# Patient Record
Sex: Female | Born: 1980 | Race: White | Hispanic: Yes | Marital: Married | Smoking: Current every day smoker
Health system: Southern US, Community
[De-identification: ages and names within clinical notes are randomized; demographics above are authoritative.]

## PROBLEM LIST (undated history)

## (undated) DIAGNOSIS — I10 Essential (primary) hypertension: Secondary | ICD-10-CM

## (undated) DIAGNOSIS — K219 Gastro-esophageal reflux disease without esophagitis: Secondary | ICD-10-CM

## (undated) HISTORY — PX: OTHER SURGICAL HISTORY: SHX169

## (undated) HISTORY — PX: HAND SURGERY: SHX662

## (undated) HISTORY — PX: CHOLECYSTECTOMY: SHX55

---

## 2002-01-20 ENCOUNTER — Emergency Department (HOSPITAL_COMMUNITY): Admission: EM | Admit: 2002-01-20 | Discharge: 2002-01-20 | Payer: Self-pay | Admitting: Emergency Medicine

## 2002-01-20 ENCOUNTER — Encounter: Payer: Self-pay | Admitting: Emergency Medicine

## 2002-06-06 ENCOUNTER — Ambulatory Visit (HOSPITAL_COMMUNITY): Admission: RE | Admit: 2002-06-06 | Discharge: 2002-06-06 | Payer: Self-pay | Admitting: Family Medicine

## 2002-06-06 ENCOUNTER — Encounter: Payer: Self-pay | Admitting: Family Medicine

## 2003-01-09 ENCOUNTER — Encounter: Payer: Self-pay | Admitting: Family Medicine

## 2003-01-09 ENCOUNTER — Ambulatory Visit (HOSPITAL_COMMUNITY): Admission: RE | Admit: 2003-01-09 | Discharge: 2003-01-09 | Payer: Self-pay | Admitting: Family Medicine

## 2003-02-26 ENCOUNTER — Encounter: Payer: Self-pay | Admitting: Family Medicine

## 2003-02-26 ENCOUNTER — Ambulatory Visit (HOSPITAL_COMMUNITY): Admission: RE | Admit: 2003-02-26 | Discharge: 2003-02-26 | Payer: Self-pay | Admitting: Family Medicine

## 2003-03-11 ENCOUNTER — Ambulatory Visit (HOSPITAL_COMMUNITY): Admission: RE | Admit: 2003-03-11 | Discharge: 2003-03-11 | Payer: Self-pay | Admitting: Pediatrics

## 2003-08-29 ENCOUNTER — Emergency Department (HOSPITAL_COMMUNITY): Admission: EM | Admit: 2003-08-29 | Discharge: 2003-08-30 | Payer: Self-pay | Admitting: Emergency Medicine

## 2003-11-04 ENCOUNTER — Ambulatory Visit (HOSPITAL_COMMUNITY): Admission: RE | Admit: 2003-11-04 | Discharge: 2003-11-04 | Payer: Self-pay | Admitting: Family Medicine

## 2003-12-01 ENCOUNTER — Ambulatory Visit (HOSPITAL_COMMUNITY): Admission: RE | Admit: 2003-12-01 | Discharge: 2003-12-01 | Payer: Self-pay | Admitting: General Surgery

## 2003-12-02 ENCOUNTER — Emergency Department (HOSPITAL_COMMUNITY): Admission: EM | Admit: 2003-12-02 | Discharge: 2003-12-02 | Payer: Self-pay | Admitting: *Deleted

## 2005-08-05 ENCOUNTER — Ambulatory Visit (HOSPITAL_COMMUNITY): Admission: RE | Admit: 2005-08-05 | Discharge: 2005-08-05 | Payer: Self-pay | Admitting: Family Medicine

## 2006-02-09 ENCOUNTER — Ambulatory Visit (HOSPITAL_COMMUNITY): Admission: RE | Admit: 2006-02-09 | Discharge: 2006-02-09 | Payer: Self-pay | Admitting: Orthopaedic Surgery

## 2006-02-09 ENCOUNTER — Encounter (INDEPENDENT_AMBULATORY_CARE_PROVIDER_SITE_OTHER): Payer: Self-pay | Admitting: *Deleted

## 2006-03-30 ENCOUNTER — Encounter (HOSPITAL_COMMUNITY): Admission: RE | Admit: 2006-03-30 | Discharge: 2006-04-29 | Payer: Self-pay | Admitting: Family Medicine

## 2011-04-15 NOTE — Op Note (Signed)
NAME:  [name], [name]                         ACCOUNT NO.:  [id number]   MEDICAL RECORD NO.:  [id number]                   PATIENT TYPE:  AMB   LOCATION:  DAY                                  FACILITY:  [facility]   PHYSICIAN:  Dalia Heading, M.D.               DATE OF BIRTH:  1981-07-23   DATE OF PROCEDURE:  DATE OF DISCHARGE:                                 OPERATIVE REPORT   PREOPERATIVE DIAGNOSIS:  Cholecystitis, cholelithiasis.   POSTOPERATIVE DIAGNOSIS:  Cholecystitis, cholelithiasis.   PROCEDURE:  Laparoscopic cholecystectomy.   SURGEON:  Dalia Heading, M.D.   ASSISTANT:  Buena Irish, M.D.   ANESTHESIA:  General endotracheal.   INDICATIONS:  The patient is a 30 year old white female who presents with  biliary colic secondary to cholelithiasis.  her liver enzyme tests are  within normal limits except for a slightly elevated SGPT.  Her common bile  duct was noted to be along the upper limits of normal for size, though no  choledocholithiasis was seen.  The patient now comes to the operating room  for laparoscopic cholecystectomy.  The risks and benefits of the procedure  including:  Bleeding, infection, hepatobiliary injury, and the possibility  of an open procedure were fully explained to the patient, who gave informed  consent.   DESCRIPTION OF PROCEDURE:  The patient was placed in the supine position.  After induction of general endotracheal anesthesia, the abdomen was prepped  and draped using the usual sterile technique with Betadine.  Surgical site  confirmation was performed.   An infraumbilical incision was made down to the fascia.  A Veress needle was  introduced into the abdominal cavity and confirmation of placement was done  using the saline drop test.  The abdomen was then insufflated to 16 mmHg  pressure.  An 11-mm trocar was introduced into the abdominal cavity under  direct visualization without difficulty.  The patient was placed in reverse  Trendelenburg position and an additional 11-mm trocar was placed in the  epigastric region and 5-mm trocars were placed in the right upper quadrant  and right flank regions. The liver was inspected and noted to be within  normal limits.   The gallbladder was retracted superiorly and laterally.  The dissection was  begun around the infundibulum of the gallbladder.  The cystic duct was first  identified.  Its junction to the infundibulum fully identified.  The cystic  duct was very small, thus a cholangiogram was not performed.  The cystic  duct was ligated and divided without difficulty.  This was likewise done on  the cystic artery.  The gallbladder was then freed away from the gallbladder  fossa using Bovie electrocautery.  The gallbladder was delivered through the  epigastric trocar site using an EndoCatch bag.  The gallbladder fossa was  inspected and no abnormal bleeding or bile leakage was noted.  Surgicel was  placed in the gallbladder  fossa.  All fluid and air were then evacuated from  the abdominal cavity prior to removal of the trocars.   All wounds were irrigated with normal saline.  All wounds were injected with  0.5% Sensorcaine.  The infraumbilical fascia as well as epigastric fascia  were reapproximated using an #0 Vicryl interrupted suture. All skin  incisions were closed using staples.  Betadine ointment and dry sterile  dressings were applied.   All tape and needle counts correct at the end of the procedure.  The patient  was extubated in the operating room and went back to recovery room in awake  and stable condition.   COMPLICATIONS:  None.   SPECIMENS:  Gallbladder with stones.   BLOOD LOSS:  Minimal.      ___________________________________________                                            Dalia Heading, M.D.   MAJ/MEDQ  D:  12/01/2003  T:  12/01/2003  Job:  [id number]   cc:   Kirk Ruths, M.D.  [address]  [place]  [place] [address]  Fax:  [phone]   Dalia Heading, M.D.  [address], [place]  [place] [address]  Fax: [phone]

## 2011-04-15 NOTE — H&P (Signed)
[name], [name]               ACCOUNT NO.:  [id number]   MEDICAL RECORD NO.:  [id number]          PATIENT TYPE:  AMB   LOCATION:  DAY                           FACILITY:  [facility]   PHYSICIAN:  J. Darreld Mclean, M.D. DATE OF BIRTH:  May 31, 1981   DATE OF ADMISSION:  DATE OF DISCHARGE:  LH                                HISTORY & PHYSICAL   CHIEF COMPLAINT:  Cyst of the left long finger and triggering.   The patient is a 30 year old female who has had pain and tenderness in her  left hand, particularly with the long finger for several weeks. She was seen  by Dr. Regino Schultze in his office on January 30 complaining of pain and swelling  to her left hand and a small cyst present that was palpable over the  metacarpal head. We saw her back in the office on February 19 and also March  7. He had her seen by Dr. Metro Kung who injected her hand, and the cyst  remained and her pain remained, and she is still triggering to her long  finger. She has been on Naprosyn. She has been splinted. She has had rest.  Still having pain and tenderness. She was seen by me on March 12, having  continued pain to her hand. If she grabs anything, it hurts. She would like  to have the cyst removed and something done with the triggering to her  finger. There is no history of trauma. No other joints are affected.   The patient has a history of hypertension. She denies any allergies. She is  currently taking Nexium 40 mg daily and Naprosyn 500 mg b.i.d. She smokes a  pack of cigarettes per day. She does not use alcoholic beverages. Dr.  Regino Schultze is her family doctor. She is status post cholecystectomy in 2005 and  a heart catheterization in 2005. Heart problems and hypertension run in the  family. She says that the hand pain may have begun around December 28 while  at work, but worker's compensation denied any injury and said she had some  problem with her hand at worse. It has gotten progressively worse since   then.   PHYSICAL EXAMINATION:  VITAL SIGNS:  BP 120/84, pulse 78, respiratory rate  16, afebrile. Height 5 foot 2-3/4 inches, 163 pounds.  GENERAL:  She is alert, cooperative, oriented.  HEENT:  Negative.  NECK:  Supple.  LUNGS:  Clear to P&A.  HEART:  Regular rhythm without murmur heard.  ABDOMEN:  Soft, nontender without masses.  EXTREMITIES:  The right hand is negative. The left hand has pain and  tenderness over the left long finger at the A1 pulley area, and there is a  very small ganglion cyst present just proximal to that. Neurovascular is  intact. Grip strengths are normal. She has some slight triggering of the  left long finger.  SKIN:  Intact.   IMPRESSION:  1.  Small ganglion cyst, left hand around the long finger on the volar side      and triggering at the A1 pulley.  2.  History of hypertension.  3.  Status post heart catheterization.   PLAN:  Open removal of the A1 pulley and removal of the ganglion cyst on the  tendon sheath. I explained the procedure, risks and imponderables. She  appears to understand. Labs are pending.                                            ______________________________  J. Darreld Mclean, M.D.     JWK/MEDQ  D:  02/08/2006  T:  02/08/2006  Job:  [id number]

## 2011-04-15 NOTE — H&P (Signed)
NAME:  [name], [place] NO.:  [id number]   MEDICAL RECORD NO.:  [id number]                  PATIENT TYPE:   LOCATION:                                       FACILITY:   PHYSICIAN:  Dalia Heading, M.D.               DATE OF BIRTH:  Jan 27, 1981   DATE OF ADMISSION:  DATE OF DISCHARGE:                                HISTORY & PHYSICAL   CHIEF COMPLAINT:  Biliary colic secondary to cholelithiasis.   HISTORY OF PRESENT ILLNESS:  Patient is a 30 year old white female who was  referred for evaluation and treatment of biliary colic secondary to  cholelithiasis.  She has been having some intermittent episodes of right  flank pain, nausea, and bloating for many months.  Some indigestion is  noted.  She does have fatty food intolerance.  No fever, chills, or jaundice  have been noted.   PAST MEDICAL HISTORY:  Unremarkable.   PAST SURGICAL HISTORY:  She had a heart catheterization approximately one  year ago for heart palpitations which was within normal limits.   CURRENT MEDICATIONS:  None.   ALLERGIES:  NO KNOWN DRUG ALLERGIES.   REVIEW OF SYSTEMS:  The patient does smoke a pack of cigarettes a day.  She  denies alcohol use, she denies any other cardiopulmonary difficulties or  bleeding disorders.  No further heart palpitations have been noted.   PHYSICAL EXAMINATION:  Patient is a well-developed, well-nourished white  female in no acute distress.  She is afebrile and vital signs are stable.  HEENT EXAMINATION:  Reveals no scleral icterus.  LUNGS:  Clear to auscultation with decompressed sounds bilaterally.  HEART EXAMINATION:  Reveals a regular rate and rhythm without S3, S4, or  murmurs.  ABDOMEN:  Soft with slight tenderness noted in the right upper quadrant to  palpation.  No hepatosplenomegaly, masses, hernias are identified.   Ultrasound of the gallbladder reveals cholelithiasis with a borderline  gallbladder bile duct size but no  choledocholithiasis.   IMPRESSION:  Cholecystitis, cholelithiasis.   PLAN:  The patient is scheduled for laparoscopic cholecystectomy with  cholangiograms on December 01, 2003.  The risks and benefits of the procedure  including bleeding, infection, hepatobiliary injury, and the possibility of  an open procedure were fully explained to the patient, who gave informed  consent.     ___________________________________________                                         Dalia Heading, M.D.   MAJ/MEDQ  D:  11/26/2003  T:  11/26/2003  Job:  [id number]   cc:   Kirk Ruths, M.D.  [address]  [place]  [place] [address]  Fax: [phone]

## 2011-04-15 NOTE — Op Note (Signed)
[name], [name]               ACCOUNT NO.:  [id number]   MEDICAL RECORD NO.:  [id number]          PATIENT TYPE:  AMB   LOCATION:  DAY                           FACILITY:  [facility]   PHYSICIAN:  J. Darreld Mclean, M.D. DATE OF BIRTH:  [birth year]   DATE OF PROCEDURE:  02/09/2006  DATE OF DISCHARGE:                                 OPERATIVE REPORT   PREOPERATIVE DIAGNOSES:  1.  Small ganglion cyst off the tendon sheath.  2.  Trigger finger, left long.   POSTOPERATIVE DIAGNOSES:  1.  Small ganglion cyst off the tendon sheath.  2.  Trigger finger, left long.   PROCEDURE:  1.  Excision of a small ganglion cyst off the tendon sheath of the left      long.  2.  Excision of A1 pulley, left long.   ANESTHESIA:  Bier block.   SURGEON:  J. Darreld Mclean, M.D.   DRAINS:  No drains.   TOURNIQUET TIME:  Please refer to anesthesia record.   INDICATIONS:  The patient is a 30 year old female who has had pain and  tenderness in the left long finger. She has a small ganglion cyst.  She has  been seen by Dr. Phillips Odor; and Dr. __________ who did an area of the cyst  which still remains, tenderness remains.  She has some triggering of the  left long finger.  It has not gotten better with conservative treatment and  she desires surgery.  The risks and imponderables of the procedure were  discussed.  I also explained to her that the cyst could reoccur over time.   DESCRIPTION OF PROCEDURE:  The patient was seen in the holding area. The  left hand was identified as the correct site.  I placed a mark directly over  the cyst area that she was complaining about.  She was taken to the  operating room after she had placed a mark on it.  She underwent a Bier  block anesthesia while supine.  She was prepped and draped in the usual  manner.  We had a time out and reidentified the patient, and reidentified  the left long finger, and the palm of the hand as the surgical site.   A long incision was made  with careful dissection.  The tendon sheath was  identified and the neurovascular bundles were identified and retracted.  A  small cyst was present just right off the tendon sheath and it popped in  as we approached it.  It was very small.  I excised this area of the tendon  sheath and also went ahead and released the A1 pulley.  I could see changes  on the tendon sheath which she had been rubbing up against the pulley.   Neurovascular bundles were inspected and no apparent injury.  The wound was  reapproximated using 3-0 Nylon, interrupted vertical mattress manner.  It  should be noted before closure of the wound I looked around and there were  no other signs of any other signs of any other lesion, any other cyst, and  we felt that there was  no other area of abnormality that could be  identified.   The wound was reapproximated with 3-0 Nylon interrupted vertical mattress  manner.  Bulky dressing applied, sheet cotton applied; sheet cotton cut  dorsally; Ace bandage applied, loosely.  The patient tolerated the procedure  well and went to recovery in good condition.  Prescription for Vicodin ES  given for pain.  I will see her in the office in approximately 10 days to 2  weeks; if any difficulty, she is to contact me through the office hospital  beeper system.           ______________________________  Shela Commons. Darreld Mclean, M.D.     JWK/MEDQ  D:  02/09/2006  T:  02/10/2006  Job:  [id number]

## 2013-01-04 ENCOUNTER — Ambulatory Visit (HOSPITAL_COMMUNITY)
Admission: RE | Admit: 2013-01-04 | Discharge: 2013-01-04 | Disposition: A | Discharge status: Home / Self Care | Payer: Medicaid Other | Source: Ambulatory Visit | Attending: Family Medicine | Admitting: Family Medicine

## 2013-01-04 ENCOUNTER — Other Ambulatory Visit (HOSPITAL_COMMUNITY): Payer: Self-pay | Admitting: Family Medicine

## 2013-01-04 DIAGNOSIS — M549 Dorsalgia, unspecified: Secondary | ICD-10-CM

## 2013-01-04 DIAGNOSIS — M546 Pain in thoracic spine: Secondary | ICD-10-CM | POA: Insufficient documentation

## 2013-12-23 ENCOUNTER — Emergency Department (HOSPITAL_COMMUNITY)
Admission: EM | Admit: 2013-12-23 | Discharge: 2013-12-23 | Disposition: A | Discharge status: Home / Self Care | Payer: Medicaid Other | Attending: Emergency Medicine | Admitting: Emergency Medicine

## 2013-12-23 ENCOUNTER — Encounter (HOSPITAL_COMMUNITY): Payer: Self-pay | Admitting: Emergency Medicine

## 2013-12-23 DIAGNOSIS — Z79899 Other long term (current) drug therapy: Secondary | ICD-10-CM | POA: Insufficient documentation

## 2013-12-23 DIAGNOSIS — Z3202 Encounter for pregnancy test, result negative: Secondary | ICD-10-CM | POA: Insufficient documentation

## 2013-12-23 DIAGNOSIS — M545 Low back pain, unspecified: Secondary | ICD-10-CM

## 2013-12-23 DIAGNOSIS — F17200 Nicotine dependence, unspecified, uncomplicated: Secondary | ICD-10-CM | POA: Insufficient documentation

## 2013-12-23 LAB — URINALYSIS, ROUTINE W REFLEX MICROSCOPIC
BILIRUBIN URINE: NEGATIVE
Glucose, UA: NEGATIVE mg/dL
Hgb urine dipstick: NEGATIVE
KETONES UR: NEGATIVE mg/dL
LEUKOCYTES UA: NEGATIVE
NITRITE: NEGATIVE
Protein, ur: NEGATIVE mg/dL
UROBILINOGEN UA: 0.2 mg/dL (ref 0.0–1.0)
pH: 6.5 (ref 5.0–8.0)

## 2013-12-23 LAB — PREGNANCY, URINE: Preg Test, Ur: NEGATIVE

## 2013-12-23 MED ORDER — NAPROXEN 500 MG PO TABS: 500.0000 mg | ORAL_TABLET | Freq: Two times a day (BID) | ORAL | Status: DC

## 2013-12-23 MED ORDER — CYCLOBENZAPRINE HCL 5 MG PO TABS: 5.0000 mg | ORAL_TABLET | Freq: Three times a day (TID) | ORAL | Status: DC | PRN

## 2013-12-23 NOTE — Discharge Instructions (Signed)
Continue ice/heat to your back. Take the medications as prescribed ($ 4 at [facility]). Recheck by your primary doctor if not improving in the next week.     Back Injury Prevention Back injuries can be extremely painful and difficult to heal. After having one back injury, you are much more likely to experience another later on. It is important to learn how to avoid injuring or re-injuring your back. The following tips can help you to prevent a back injury. PHYSICAL FITNESS  Exercise regularly and try to develop good tone in your abdominal muscles. Your abdominal muscles provide a lot of the support needed by your back.  Do aerobic exercises (walking, jogging, biking, swimming) regularly.  Do exercises that increase balance and strength (tai chi, yoga) regularly. This can decrease your risk of falling and injuring your back.  Stretch before and after exercising.  Maintain a healthy weight. The more you weigh, the more stress is placed on your back. For every pound of weight, 10 times that amount of pressure is placed on the back. DIET  Talk to your caregiver about how much calcium and vitamin D you need per day. These nutrients help to prevent weakening of the bones (osteoporosis). Osteoporosis can cause broken (fractured) bones that lead to back pain.  Include good sources of calcium in your diet, such as dairy products, green, leafy vegetables, and products with calcium added (fortified).  Include good sources of vitamin D in your diet, such as milk and foods that are fortified with vitamin D.  Consider taking a nutritional supplement or a multivitamin if needed.  Stop smoking if you smoke. POSTURE  Sit and stand up straight. Avoid leaning forward when you sit or hunching over when you stand.  Choose chairs with good low back (lumbar) support.  If you work at a desk, sit close to your work so you do not need to lean over. Keep your chin tucked in. Keep your neck drawn back and elbows  bent at a right angle. Your arms should look like the letter "L."  Sit high and close to the steering wheel when you drive. Add a lumbar support to your car seat if needed.  Avoid sitting or standing in one position for too long. Take breaks to get up, stretch, and walk around at least once every hour. Take breaks if you are driving for long periods of time.  Sleep on your side with your knees slightly bent, or sleep on your back with a pillow under your knees. Do not sleep on your stomach. LIFTING, TWISTING, AND REACHING  Avoid heavy lifting, especially repetitive lifting. If you must do heavy lifting:  Stretch before lifting.  Work slowly.  Rest between lifts.  Use carts and dollies to move objects when possible.  Make several small trips instead of carrying 1 heavy load.  Ask for help when you need it.  Ask for help when moving big, awkward objects.  Follow these steps when lifting:  Stand with your feet shoulder-width apart.  Get as close to the object as you can. Do not try to pick up heavy objects that are far from your body.  Use handles or lifting straps if they are available.  Bend at your knees. Squat down, but keep your heels off the floor.  Keep your shoulders pulled back, your chin tucked in, and your back straight.  Lift the object slowly, tightening the muscles in your legs, abdomen, and buttocks. Keep the object as close to the  center of your body as possible.  When you put a load down, use these same guidelines in reverse.  Do not:  Lift the object above your waist.  Twist at the waist while lifting or carrying a load. Move your feet if you need to turn, not your waist.  Bend over without bending at your knees.  Avoid reaching over your head, across a table, or for an object on a high surface. OTHER TIPS  Avoid wet floors and keep sidewalks clear of ice to prevent falls.  Do not sleep on a mattress that is too soft or too hard.  Keep items that  are used frequently within easy reach.  Put heavier objects on shelves at waist level and lighter objects on lower or higher shelves.  Find ways to decrease your stress, such as exercise, massage, or relaxation techniques. Stress can build up in your muscles. Tense muscles are more vulnerable to injury.  Seek treatment for depression or anxiety if needed. These conditions can increase your risk of developing back pain. SEEK MEDICAL CARE IF:  You injure your back.  You have questions about diet, exercise, or other ways to prevent back injuries. MAKE SURE YOU:  Understand these instructions.  Will watch your condition.  Will get help right away if you are not doing well or get worse. Document Released: 12/22/2004 Document Revised: 02/06/2012 Document Reviewed: 12/26/2011 [facility] Patient Information 2014 [place], [place].  Back Exercises Back exercises help treat and prevent back injuries. The goal of back exercises is to increase the strength of your abdominal and back muscles and the flexibility of your back. These exercises should be started when you no longer have back pain. Back exercises include:  Pelvic Tilt. Lie on your back with your knees bent. Tilt your pelvis until the lower part of your back is against the floor. Hold this position 5 to 10 sec and repeat 5 to 10 times.  Knee to Chest. Pull first 1 knee up against your chest and hold for 20 to 30 seconds, repeat this with the other knee, and then both knees. This may be done with the other leg straight or bent, whichever feels better.  Sit-Ups or Curl-Ups. Bend your knees 90 degrees. Start with tilting your pelvis, and do a partial, slow sit-up, lifting your trunk only 30 to 45 degrees off the floor. Take at least 2 to 3 seconds for each sit-up. Do not do sit-ups with your knees out straight. If partial sit-ups are difficult, simply do the above but with only tightening your abdominal muscles and holding it as  directed.  Hip-Lift. Lie on your back with your knees flexed 90 degrees. Push down with your feet and shoulders as you raise your hips a couple inches off the floor; hold for 10 seconds, repeat 5 to 10 times.  Back arches. Lie on your stomach, propping yourself up on bent elbows. Slowly press on your hands, causing an arch in your low back. Repeat 3 to 5 times. Any initial stiffness and discomfort should lessen with repetition over time.  Shoulder-Lifts. Lie face down with arms beside your body. Keep hips and torso pressed to floor as you slowly lift your head and shoulders off the floor. Do not overdo your exercises, especially in the beginning. Exercises may cause you some mild back discomfort which lasts for a few minutes; however, if the pain is more severe, or lasts for more than 15 minutes, do not continue exercises until you see your caregiver.  Improvement with exercise therapy for back problems is slow.  See your caregivers for assistance with developing a proper back exercise program. Document Released: 12/22/2004 Document Revised: 02/06/2012 Document Reviewed: 09/15/2011 [facility] Patient Information 2014 [place].

## 2013-12-23 NOTE — ED Notes (Signed)
Patient with no complaints at this time. Respirations even and unlabored. Skin warm/dry. Discharge instructions reviewed with patient at this time. Patient given opportunity to voice concerns/ask questions. Patient discharged at this time and left Emergency Department with steady gait.   

## 2013-12-23 NOTE — ED Provider Notes (Signed)
CSN: [id number]     Arrival date & time 12/23/13  8295 History  This chart was scribed for Ward Givens, MD by Dorothey Baseman, ED Scribe. This patient was seen in room APA05/APA05 and the patient's care was started at 10:27 AM.    Chief Complaint  Patient presents with  . Back Pain   The history is provided by the patient. No language interpreter was used.   HPI Comments: [name] is a 33 y.o. female who presents to the Emergency Department complaining of a constant, pressure-like pain to the lower back, around the sacral region, onset 6 days ago. Patient states that the pain is exacerbated with sitting, standing, and movement. She reports applying heat to the area with mild, temporary relief. She states that the pain will intermittently radiate to the right leg that lasts only a few minutes, which is exacerbated with laying down at night. She reports some intermittent pain to the right arm and points to her prox forearm, but expresses that she does not believe this is related to her back pain. She denies any potential injury or trauma to the area, but states that she does a lot of lifting and bending. Patient reports a history of problems in her back (i.e. Pulled muscles), but states that her current symptoms do not feel similar. She denies fever, emesis, nausea, dysuria, urinary frequency, vaginal discharge, abdominal pain, numbness, bowel or bladder incontinence, neck pain. Patient has no other pertinent medical history.    She states she was seen by her PCP last week and placed on hydrocodone without relief.  PCP Dr Regino Schultze  History reviewed. No pertinent past medical history. Past Surgical History  Procedure Laterality Date  . Cholecystectomy    . Hand surgery     No family history on file. History  Substance Use Topics  . Smoking status: Current Every Day Smoker -- 1.00 packs/day    Types: Cigarettes  . Smokeless tobacco: Not on file  . Alcohol Use: No   unemployed  OB History    Grav Para Term Preterm Abortions TAB SAB Ect Mult Living                 Review of Systems  Constitutional: Negative for fever.  Gastrointestinal: Negative for nausea, vomiting and abdominal pain.  Genitourinary: Negative for dysuria, frequency and vaginal discharge.  Musculoskeletal: Positive for back pain and myalgias. Negative for neck pain.  Neurological: Negative for numbness.  All other systems reviewed and are negative.    Allergies  Review of patient's allergies indicates no known allergies.  Home Medications   Current Outpatient Rx  Name  Route  Sig  Dispense  Refill  . HYDROcodone-acetaminophen (NORCO) 10-325 MG per tablet   Oral   Take 1 tablet by mouth daily as needed for moderate pain.         . naproxen sodium (ANAPROX) 220 MG tablet   Oral   Take 440 mg by mouth daily as needed (pain).         Marland Kitchen omeprazole (PRILOSEC) 20 MG capsule   Oral   Take 20 mg by mouth 2 (two) times daily before a meal.           Triage Vitals: BP 132/88  Pulse 74  Temp(Src) 98.7 F (37.1 C) (Oral)  Resp 16  Ht 5\' 4"  (1.626 m)  Wt 180 lb (81.647 kg)  BMI 30.88 kg/m2  SpO2 100%  LMP 11/28/2013  Vital signs normal  Physical Exam  Nursing note and vitals reviewed. Constitutional: She is oriented to person, place, and time. She appears well-developed and well-nourished.  Non-toxic appearance. She does not appear ill. No distress.  HENT:  Head: Normocephalic and atraumatic.  Right Ear: External ear normal.  Left Ear: External ear normal.  Nose: Nose normal. No mucosal edema or rhinorrhea.  Mouth/Throat: Oropharynx is clear and moist and mucous membranes are normal. No dental abscesses or uvula swelling.  Eyes: Conjunctivae and EOM are normal. Pupils are equal, round, and reactive to light.  Neck: Normal range of motion and full passive range of motion without pain. Neck supple.  Cardiovascular: Normal rate, regular rhythm and normal heart sounds.  Exam reveals no  gallop and no friction rub.   No murmur heard. Pulmonary/Chest: Effort normal and breath sounds normal. No respiratory distress. She has no wheezes. She has no rhonchi. She has no rales. She exhibits no tenderness and no crepitus.  Abdominal: Soft. Normal appearance and bowel sounds are normal. She exhibits no distension. There is no tenderness. There is no rebound and no guarding.  Musculoskeletal: Normal range of motion. She exhibits tenderness. She exhibits no edema.       Back:  Moves all extremities well. Tenderness that begins in the mid-lumbar area with tenderness to the bilateral lumbar paraspinal muscles and over both SI joints. She only has pain with forward flexion with range of motion of her waist. Negative straight-leg raise bilaterally.   Neurological: She is alert and oriented to person, place, and time. She has normal strength and normal reflexes. She displays normal reflexes. No cranial nerve deficit.  Reflex Scores:      Patellar reflexes are 2+ on the right side and 2+ on the left side. Patellar reflexes are 2+ and equal.   Skin: Skin is warm, dry and intact. No rash noted. No erythema. No pallor.  Psychiatric: She has a normal mood and affect. Her speech is normal and behavior is normal. Her mood appears not anxious.    ED Course  Procedures (including critical care time)     DIAGNOSTIC STUDIES: Oxygen Saturation is 100% on room air, normal by my interpretation.    COORDINATION OF CARE: 10:34 AM- Discussed that UA results were normal and do not indicate any infection or signs of kidney stones. Will discharge patient with Naprosyn and Flexeril to manage symptoms. Advised patient to alternate heat and ice at home. Discussed treatment plan with patient at bedside and patient verbalized agreement.   Results for orders placed during the hospital encounter of 12/23/13  URINALYSIS, ROUTINE W REFLEX MICROSCOPIC      Result Value Range   Color, Urine YELLOW  YELLOW    APPearance CLEAR  CLEAR   Specific Gravity, Urine <1.005 (*) 1.005 - 1.030   pH 6.5  5.0 - 8.0   Glucose, UA NEGATIVE  NEGATIVE mg/dL   Hgb urine dipstick NEGATIVE  NEGATIVE   Bilirubin Urine NEGATIVE  NEGATIVE   Ketones, ur NEGATIVE  NEGATIVE mg/dL   Protein, ur NEGATIVE  NEGATIVE mg/dL   Urobilinogen, UA 0.2  0.0 - 1.0 mg/dL   Nitrite NEGATIVE  NEGATIVE   Leukocytes, UA NEGATIVE  NEGATIVE  PREGNANCY, URINE      Result Value Range   Preg Test, Ur NEGATIVE  NEGATIVE    Laboratory interpretation all normal   EKG Interpretation   None       MDM   1. Low back pain    Discharge Medication List  as of 12/23/2013 10:39 AM    START taking these medications   Details  cyclobenzaprine (FLEXERIL) 5 MG tablet Take 1 tablet (5 mg total) by mouth 3 (three) times daily as needed for muscle spasms., Starting 12/23/2013, Until Discontinued, Print    naproxen (NAPROSYN) 500 MG tablet Take 1 tablet (500 mg total) by mouth 2 (two) times daily., Starting 12/23/2013, Until Discontinued, Print        Plan discharge   Devoria Albe, MD, FACEP   I personally performed the services described in this documentation, which was scribed in my presence. The recorded information has been reviewed and considered.  Devoria Albe, MD, Armando Gang     Ward Givens, MD 12/23/13 [phone]

## 2013-12-23 NOTE — ED Notes (Signed)
Patient with c/o back pain x 6 days. Denies injury. Denies urinary symptoms, denies gait problems. Patient states some relief with Aleve. Occupational history of lifting and bending. Patient ambulatory with steady gait. No loss of bowel or bladder. Denies medical history. States pain is pressure in lower back and with certain positions will radiate to right leg.

## 2014-01-29 ENCOUNTER — Emergency Department (HOSPITAL_COMMUNITY): Payer: Self-pay

## 2014-01-29 ENCOUNTER — Emergency Department (HOSPITAL_COMMUNITY)
Admission: EM | Admit: 2014-01-29 | Discharge: 2014-01-29 | Disposition: A | Discharge status: Home / Self Care | Payer: Self-pay | Attending: Emergency Medicine | Admitting: Emergency Medicine

## 2014-01-29 ENCOUNTER — Encounter (HOSPITAL_COMMUNITY): Payer: Self-pay | Admitting: Emergency Medicine

## 2014-01-29 DIAGNOSIS — R071 Chest pain on breathing: Secondary | ICD-10-CM | POA: Insufficient documentation

## 2014-01-29 DIAGNOSIS — F17200 Nicotine dependence, unspecified, uncomplicated: Secondary | ICD-10-CM | POA: Insufficient documentation

## 2014-01-29 DIAGNOSIS — M542 Cervicalgia: Secondary | ICD-10-CM | POA: Insufficient documentation

## 2014-01-29 DIAGNOSIS — Z79899 Other long term (current) drug therapy: Secondary | ICD-10-CM | POA: Insufficient documentation

## 2014-01-29 DIAGNOSIS — R079 Chest pain, unspecified: Secondary | ICD-10-CM

## 2014-01-29 DIAGNOSIS — Z880 Allergy status to penicillin: Secondary | ICD-10-CM | POA: Insufficient documentation

## 2014-01-29 DIAGNOSIS — R1110 Vomiting, unspecified: Secondary | ICD-10-CM | POA: Insufficient documentation

## 2014-01-29 LAB — CBC WITH DIFFERENTIAL/PLATELET
Basophils Absolute: 0 10*3/uL (ref 0.0–0.1)
Basophils Relative: 0 % (ref 0–1)
Eosinophils Absolute: 0.1 10*3/uL (ref 0.0–0.7)
Eosinophils Relative: 2 % (ref 0–5)
HCT: 33.7 % — ABNORMAL LOW (ref 36.0–46.0)
HEMOGLOBIN: 11.3 g/dL — AB (ref 12.0–15.0)
LYMPHS ABS: 2 10*3/uL (ref 0.7–4.0)
Lymphocytes Relative: 48 % — ABNORMAL HIGH (ref 12–46)
MCH: 30.1 pg (ref 26.0–34.0)
MCHC: 33.5 g/dL (ref 30.0–36.0)
MCV: 89.9 fL (ref 78.0–100.0)
MONOS PCT: 2 % — AB (ref 3–12)
Monocytes Absolute: 0.1 10*3/uL (ref 0.1–1.0)
NEUTROS ABS: 2 10*3/uL (ref 1.7–7.7)
Neutrophils Relative %: 48 % (ref 43–77)
PLATELETS: 251 10*3/uL (ref 150–400)
RBC: 3.75 MIL/uL — ABNORMAL LOW (ref 3.87–5.11)
RDW: 16.1 % — ABNORMAL HIGH (ref 11.5–15.5)
WBC: 4.1 10*3/uL (ref 4.0–10.5)

## 2014-01-29 LAB — COMPREHENSIVE METABOLIC PANEL
ALBUMIN: 3.3 g/dL — AB (ref 3.5–5.2)
ALK PHOS: 73 U/L (ref 39–117)
ALT: 21 U/L (ref 0–35)
AST: 23 U/L (ref 0–37)
BILIRUBIN TOTAL: 0.3 mg/dL (ref 0.3–1.2)
BUN: 5 mg/dL — AB (ref 6–23)
CHLORIDE: 101 meq/L (ref 96–112)
CO2: 29 mEq/L (ref 19–32)
Calcium: 8.9 mg/dL (ref 8.4–10.5)
Creatinine, Ser: 0.71 mg/dL (ref 0.50–1.10)
GFR calc non Af Amer: >90 mL/min (ref >90)
GLUCOSE: 91 mg/dL (ref 70–99)
Potassium: 3.2 mEq/L — ABNORMAL LOW (ref 3.7–5.3)
Sodium: 140 mEq/L (ref 137–147)
TOTAL PROTEIN: 7.3 g/dL (ref 6.0–8.3)

## 2014-01-29 LAB — I-STAT TROPONIN, ED: TROPONIN I, POC: 0.01 ng/mL (ref 0.00–0.08)

## 2014-01-29 MED ORDER — ASPIRIN 81 MG PO CHEW
324.0000 mg | CHEWABLE_TABLET | Freq: Once | ORAL | Status: AC
  Administered 2014-01-29: 324 mg via ORAL
  Filled 2014-01-29: qty 4

## 2014-01-29 NOTE — ED Notes (Signed)
Patient complaining of left sided chest pain x 3 days.  

## 2014-01-29 NOTE — ED Provider Notes (Signed)
CSN: [id number]     Arrival date & time 01/29/14  2131 History  This chart was scribed for Geoffery Lyonsouglas Riven Beebe, MD by Danella Maiersaroline Early, ED Scribe. This patient was seen in room APA14/APA14 and the patient's care was started at 11:27 PM.   Chief Complaint  Patient presents with  . Chest Pain   The history is provided by the patient. No language interpreter was used.   HPI Comments: [name] is a 33 y.o. female who presents to the Emergency Department complaining of constant, stabbing frontal CP with pressure onset 3 days ago. She reports associated intermittent neck pain. She reports one episode of vomiting yesterday. She states she walks for exercise but denies CP with activity. She denies pain with breathing. She denies changes in exercise routine or heavy lifting. She has not noticed the CP coming with certain foods. She had a heart cath 9 years ago done at [facility]. She has not had any problems since then. She is otherwise healthy. She reports multiple family h/o MI. She is a smoker.    History reviewed. No pertinent past medical history. Past Surgical History  Procedure Laterality Date  . Cholecystectomy    . Hand surgery    . Cardiac catherization     History reviewed. No pertinent family history. History  Substance Use Topics  . Smoking status: Current Every Day Smoker -- 1.00 packs/day    Types: Cigarettes  . Smokeless tobacco: Not on file  . Alcohol Use: No   OB History   Grav Para Term Preterm Abortions TAB SAB Ect Mult Living                 Review of Systems  Cardiovascular: Positive for chest pain.  Gastrointestinal: Positive for vomiting.  Musculoskeletal: Positive for neck pain.   A complete 10 system review of systems was obtained and all systems are negative except as noted in the HPI and PMH.     Allergies  Amoxicillin  Home Medications   Current Outpatient Rx  Name  Route  Sig  Dispense  Refill  . HYDROcodone-acetaminophen (NORCO) 10-325 MG per tablet  Oral   Take 1 tablet by mouth daily as needed for moderate pain.         Marland Kitchen. ibuprofen (ADVIL,MOTRIN) 200 MG tablet   Oral   Take 600 mg by mouth once as needed for headache.         . naproxen sodium (ANAPROX) 220 MG tablet   Oral   Take 440 mg by mouth daily as needed (pain).         Marland Kitchen. omeprazole (PRILOSEC) 20 MG capsule   Oral   Take 20 mg by mouth 2 (two) times daily before a meal.         . Pseudoephedrine-Acetaminophen (SINUS PAIN/PRESSURE PO)   Oral   Take 2 tablets by mouth once as needed (for symptoms).          BP 174/110  Pulse 91  Temp(Src) 98.3 F (36.8 C) (Oral)  Resp 20  Ht 5\' 4"  (1.626 m)  Wt 180 lb (81.647 kg)  BMI 30.88 kg/m2  SpO2 100%  LMP 01/27/2014 Physical Exam  Nursing note and vitals reviewed. Constitutional: She is oriented to person, place, and time. She appears well-developed and well-nourished. No distress.  HENT:  Head: Normocephalic and atraumatic.  Eyes: EOM are normal.  Neck: Neck supple. No tracheal deviation present.  Cardiovascular: Normal rate and regular rhythm.  Exam reveals no gallop and  no friction rub.   No murmur heard. Pulmonary/Chest: Effort normal. No respiratory distress. She exhibits tenderness (anterior chest wall).  Musculoskeletal: Normal range of motion.  Neurological: She is alert and oriented to person, place, and time.  Skin: Skin is warm and dry.  Psychiatric: She has a normal mood and affect. Her behavior is normal.    ED Course  Procedures (including critical care time) Medications  aspirin chewable tablet 324 mg (324 mg Oral Given 01/29/14 2219)   DIAGNOSTIC STUDIES: Oxygen Saturation is 100% on RA, normal by my interpretation.    COORDINATION OF CARE: 11:40 PM- Discussed treatment plan with pt which includes ibuprofen. Pt agrees to plan.    Labs Review Labs Reviewed  CBC WITH DIFFERENTIAL - Abnormal; Notable for the following:    RBC 3.75 (*)    Hemoglobin 11.3 (*)    HCT 33.7 (*)    RDW  16.1 (*)    Lymphocytes Relative 48 (*)    Monocytes Relative 2 (*)    All other components within normal limits  COMPREHENSIVE METABOLIC PANEL - Abnormal; Notable for the following:    Potassium 3.2 (*)    BUN 5 (*)    Albumin 3.3 (*)    All other components within normal limits  I-STAT TROPOININ, ED   Imaging Review Dg Chest Portable 1 View  01/29/2014   CLINICAL DATA:  CHEST PAIN CHEST PAIN  EXAM: PORTABLE CHEST - 1 VIEW  COMPARISON:  None.  FINDINGS: The heart size and mediastinal contours are within normal limits. Both lungs are clear. The visualized skeletal structures are unremarkable.  IMPRESSION: No active disease.   Electronically Signed   By: Salome Holmes M.D.   On: 01/29/2014 23:06     EKG Interpretation   Date/Time:  Wednesday January 29 2014 21:34:10 EST Ventricular Rate:  94 PR Interval:  168 QRS Duration: 92 QT Interval:  340 QTC Calculation: 425 R Axis:   55 Text Interpretation:  Normal sinus rhythm Normal ECG No previous ECGs  available Confirmed by DELOS  MD, Wrangler Penning ([id number]) on 01/29/2014 11:42:33 PM      MDM   Final diagnoses:  None    Patient is a 33 year old female who presents with complaints of chest discomfort that started approximately 3 days ago. It has been constant in nature. It is located in the front of her chest and there is no associated nausea, diaphoresis, shortness of breath, and no radiation to the arm or jaw. She has no cardiac risk factors. EKG shows a normal sinus rhythm and troponin is negative. Chest x-ray does not reveal any acute pathology. Her pain is reproducible with palpation of the chest wall and I suspect her symptoms are musculoskeletal in nature. We'll recommend ibuprofen, rest, and time. She understands to return if her symptoms worsen or change.  I personally performed the services described in this documentation, which was scribed in my presence. The recorded information has been reviewed and is accurate.      Geoffery Lyons, MD 01/30/14 [phone]

## 2014-01-29 NOTE — Discharge Instructions (Signed)
Ibuprofen 600 mg 3 times daily for the next 5 days.  Return to the ER if you develop worsening pain, shortness of breath, productive cough, or other new or concerning symptoms.   Chest Pain (Nonspecific) It is often hard to give a specific diagnosis for the cause of chest pain. There is always a chance that your pain could be related to something serious, such as a heart attack or a blood clot in the lungs. You need to follow up with your caregiver for further evaluation. CAUSES   Heartburn.  Pneumonia or bronchitis.  Anxiety or stress.  Inflammation around your heart (pericarditis) or lung (pleuritis or pleurisy).  A blood clot in the lung.  A collapsed lung (pneumothorax). It can develop suddenly on its own (spontaneous pneumothorax) or from injury (trauma) to the chest.  Shingles infection (herpes zoster virus). The chest wall is composed of bones, muscles, and cartilage. Any of these can be the source of the pain.  The bones can be bruised by injury.  The muscles or cartilage can be strained by coughing or overwork.  The cartilage can be affected by inflammation and become sore (costochondritis). DIAGNOSIS  Lab tests or other studies, such as X-rays, electrocardiography, stress testing, or cardiac imaging, may be needed to find the cause of your pain.  TREATMENT   Treatment depends on what may be causing your chest pain. Treatment may include:  Acid blockers for heartburn.  Anti-inflammatory medicine.  Pain medicine for inflammatory conditions.  Antibiotics if an infection is present.  You may be advised to change lifestyle habits. This includes stopping smoking and avoiding alcohol, caffeine, and chocolate.  You may be advised to keep your head raised (elevated) when sleeping. This reduces the chance of acid going backward from your stomach into your esophagus.  Most of the time, nonspecific chest pain will improve within 2 to 3 days with rest and mild pain  medicine. HOME CARE INSTRUCTIONS   If antibiotics were prescribed, take your antibiotics as directed. Finish them even if you start to feel better.  For the next few days, avoid physical activities that bring on chest pain. Continue physical activities as directed.  Do not smoke.  Avoid drinking alcohol.  Only take over-the-counter or prescription medicine for pain, discomfort, or fever as directed by your caregiver.  Follow your caregiver's suggestions for further testing if your chest pain does not go away.  Keep any follow-up appointments you made. If you do not go to an appointment, you could develop lasting (chronic) problems with pain. If there is any problem keeping an appointment, you must call to reschedule. SEEK MEDICAL CARE IF:   You think you are having problems from the medicine you are taking. Read your medicine instructions carefully.  Your chest pain does not go away, even after treatment.  You develop a rash with blisters on your chest. SEEK IMMEDIATE MEDICAL CARE IF:   You have increased chest pain or pain that spreads to your arm, neck, jaw, back, or abdomen.  You develop shortness of breath, an increasing cough, or you are coughing up blood.  You have severe back or abdominal pain, feel nauseous, or vomit.  You develop severe weakness, fainting, or chills.  You have a fever. THIS IS AN EMERGENCY. Do not wait to see if the pain will go away. Get medical help at once. Call your local emergency services (911 in [place] Do not drive yourself to the hospital. MAKE SURE YOU:   Understand these instructions.  Will watch your condition.  Will get help right away if you are not doing well or get worse. Document Released: 08/24/2005 Document Revised: 02/06/2012 Document Reviewed: 06/19/2008 [facility] Patient Information 2014 [place].

## 2014-01-29 NOTE — ED Notes (Signed)
Pt alert & oriented x4, stable gait. Patient given discharge instructions, paperwork & prescription(s). Patient  instructed to stop at the registration desk to finish any additional paperwork. Patient verbalized understanding. Pt left department w/ no further questions. 

## 2014-02-06 ENCOUNTER — Emergency Department (HOSPITAL_COMMUNITY): Payer: Self-pay

## 2014-02-06 ENCOUNTER — Encounter (HOSPITAL_COMMUNITY): Payer: Self-pay | Admitting: Emergency Medicine

## 2014-02-06 ENCOUNTER — Emergency Department (HOSPITAL_COMMUNITY)
Admission: EM | Admit: 2014-02-06 | Discharge: 2014-02-07 | Disposition: A | Discharge status: Home / Self Care | Payer: Self-pay | Attending: Emergency Medicine | Admitting: Emergency Medicine

## 2014-02-06 DIAGNOSIS — R42 Dizziness and giddiness: Secondary | ICD-10-CM | POA: Insufficient documentation

## 2014-02-06 DIAGNOSIS — Z880 Allergy status to penicillin: Secondary | ICD-10-CM | POA: Insufficient documentation

## 2014-02-06 DIAGNOSIS — R110 Nausea: Secondary | ICD-10-CM | POA: Insufficient documentation

## 2014-02-06 DIAGNOSIS — Z9889 Other specified postprocedural states: Secondary | ICD-10-CM | POA: Insufficient documentation

## 2014-02-06 DIAGNOSIS — R209 Unspecified disturbances of skin sensation: Secondary | ICD-10-CM | POA: Insufficient documentation

## 2014-02-06 DIAGNOSIS — R079 Chest pain, unspecified: Secondary | ICD-10-CM

## 2014-02-06 DIAGNOSIS — F17200 Nicotine dependence, unspecified, uncomplicated: Secondary | ICD-10-CM | POA: Insufficient documentation

## 2014-02-06 DIAGNOSIS — J189 Pneumonia, unspecified organism: Secondary | ICD-10-CM

## 2014-02-06 DIAGNOSIS — Z79899 Other long term (current) drug therapy: Secondary | ICD-10-CM | POA: Insufficient documentation

## 2014-02-06 DIAGNOSIS — J159 Unspecified bacterial pneumonia: Secondary | ICD-10-CM | POA: Insufficient documentation

## 2014-02-06 LAB — CBC
HCT: 37.4 % (ref 36.0–46.0)
HEMOGLOBIN: 12.6 g/dL (ref 12.0–15.0)
MCH: 30 pg (ref 26.0–34.0)
MCHC: 33.7 g/dL (ref 30.0–36.0)
MCV: 89 fL (ref 78.0–100.0)
Platelets: 259 10*3/uL (ref 150–400)
RBC: 4.2 MIL/uL (ref 3.87–5.11)
RDW: 16.1 % — ABNORMAL HIGH (ref 11.5–15.5)
WBC: 5.4 10*3/uL (ref 4.0–10.5)

## 2014-02-06 LAB — I-STAT TROPONIN, ED: Troponin i, poc: 0 ng/mL (ref 0.00–0.08)

## 2014-02-06 LAB — BASIC METABOLIC PANEL
BUN: 5 mg/dL — AB (ref 6–23)
CALCIUM: 8.9 mg/dL (ref 8.4–10.5)
CO2: 27 meq/L (ref 19–32)
CREATININE: 0.64 mg/dL (ref 0.50–1.10)
Chloride: 101 mEq/L (ref 96–112)
GFR calc Af Amer: >90 mL/min (ref >90)
GLUCOSE: 100 mg/dL — AB (ref 70–99)
Potassium: 3.7 mEq/L (ref 3.7–5.3)
Sodium: 139 mEq/L (ref 137–147)

## 2014-02-06 MED ORDER — ASPIRIN 325 MG PO TABS
325.0000 mg | ORAL_TABLET | Freq: Once | ORAL | Status: AC
  Administered 2014-02-06: 325 mg via ORAL
  Filled 2014-02-06: qty 1

## 2014-02-06 MED ORDER — SODIUM CHLORIDE 0.9 % IV BOLUS (SEPSIS)
1000.0000 mL | Freq: Once | INTRAVENOUS | Status: AC
  Administered 2014-02-06: 1000 mL via INTRAVENOUS

## 2014-02-06 NOTE — ED Notes (Signed)
Pt reports mid chest tightness and pressure, feeling lightheaded, nausea and left arm numbness x 1 week. Airway is intact at triage, no neuro deficits noted, ekg done at triage.

## 2014-02-06 NOTE — ED Provider Notes (Signed)
CSN: [id number]     Arrival date & time 02/06/14  1556 History   First MD Initiated Contact with Patient 02/06/14 2130     Chief Complaint  Patient presents with  . Chest Pain  . Numbness     (Consider location/radiation/quality/duration/timing/severity/associated sxs/prior Treatment) The history is provided by the patient. No language interpreter was used.  [name] is a 33 y/o F with PMHx of cholecystectomy, cardiac cath in 2004 with negative findings presenting to the ED with chest pain that has been ongoing for one week. Patient reported that the chest pain is localized to the center of her chest described as a pressure, sharp tightness that is intermittent lasting approximately 1-2 hours at a time. Stated that with these episodes she has been having associated light-headedness with nausea - negative episodes of emesis. Patient reported that today she experienced numbness to the left arm that lasted 1 hour in accordance to her chest pain. Stated that she has not taken anything for the pain. Denied shortness of breath, difficulty breathing, blurred vision, visual distortions, leg swelling, hemoptysis, weakness. PCP [facility]   History reviewed. No pertinent past medical history. Past Surgical History  Procedure Laterality Date  . Cholecystectomy    . Hand surgery    . Cardiac catherization     History reviewed. No pertinent family history. History  Substance Use Topics  . Smoking status: Current Every Day Smoker -- 1.00 packs/day    Types: Cigarettes  . Smokeless tobacco: Not on file  . Alcohol Use: No   OB History   Grav Para Term Preterm Abortions TAB SAB Ect Mult Living                 Review of Systems  Constitutional: Negative for fever and chills.  Respiratory: Positive for shortness of breath. Negative for chest tightness.   Cardiovascular: Positive for chest pain.  Gastrointestinal: Positive for nausea. Negative for vomiting.  Neurological: Positive for  dizziness and numbness (left arm). Negative for weakness.  All other systems reviewed and are negative.      Allergies  Amoxicillin  Home Medications   Current Outpatient Rx  Name  Route  Sig  Dispense  Refill  . HYDROcodone-acetaminophen (NORCO) 10-325 MG per tablet   Oral   Take 1 tablet by mouth daily as needed for moderate pain.         Marland Kitchen ibuprofen (ADVIL,MOTRIN) 200 MG tablet   Oral   Take 600 mg by mouth daily as needed for headache.          . naproxen sodium (ANAPROX) 220 MG tablet   Oral   Take 440 mg by mouth daily as needed (pain).         Marland Kitchen omeprazole (PRILOSEC) 20 MG capsule   Oral   Take 20 mg by mouth 2 (two) times daily before a meal.         . Pseudoephedrine-Acetaminophen (SINUS PAIN/PRESSURE PO)   Oral   Take 2 tablets by mouth once as needed (for symptoms).         Marland Kitchen azithromycin (ZITHROMAX) 250 MG tablet   Oral   Take 1 tablet (250 mg total) by mouth daily. Take first 2 tablets together, then 1 every day until finished.   6 tablet   0    BP 129/89  Pulse 73  Temp(Src) 98.3 F (36.8 C) (Oral)  Resp 17  SpO2 98%  LMP 01/27/2014 Physical Exam  Nursing note and vitals reviewed. Constitutional:  She is oriented to person, place, and time. She appears well-developed and well-nourished. No distress.  HENT:  Head: Normocephalic and atraumatic.  Mouth/Throat: Oropharynx is clear and moist. No oropharyngeal exudate.  Eyes: Conjunctivae and EOM are normal. Pupils are equal, round, and reactive to light. Right eye exhibits no discharge. Left eye exhibits no discharge.  Neck: Normal range of motion. Neck supple. No tracheal deviation present.  Negative neck stiffness Negative nuchal rigidity  Cardiovascular: Normal rate, regular rhythm and normal heart sounds.  Exam reveals no friction rub.   No murmur heard. Cap refill < 3 seconds   Pulmonary/Chest: Effort normal and breath sounds normal. No respiratory distress. She has no wheezes. She  has no rales.  Musculoskeletal: Normal range of motion.  Full ROM to upper and lower extremities without difficulty noted, negative ataxia noted.  Lymphadenopathy:    She has no cervical adenopathy.  Neurological: She is alert and oriented to person, place, and time. No cranial nerve deficit. She exhibits normal muscle tone. Coordination normal.  Cranial nerves III-XII grossly intact Strength 5+/5+ to upper and lower extremities bilaterally with resistance applied, equal distribution noted Equal grip strength  Sensation intact to upper left extremity with differentiation to sharp and dull touch   Skin: Skin is warm and dry. No rash noted. She is not diaphoretic. No erythema.  Psychiatric: She has a normal mood and affect. Her behavior is normal. Thought content normal.    ED Course  Procedures (including critical care time)  11:48 PM This provider was made aware that the patient does not want IV - stated that if she is able to go home she would like to go home.   Pulse ox upon ambulation was within normal limits, the lowest the pulse ox got was 91% - negative shortness of breath or difficulty breathing.   Labs Review Labs Reviewed  CBC - Abnormal; Notable for the following:    RDW 16.1 (*)    All other components within normal limits  BASIC METABOLIC PANEL - Abnormal; Notable for the following:    Glucose, Bld 100 (*)    BUN 5 (*)    All other components within normal limits  I-STAT TROPOININ, ED  Rosezena Sensor, ED   Imaging Review Dg Chest 2 View  02/06/2014   CLINICAL DATA:  Chest pain  EXAM: CHEST  2 VIEW  COMPARISON:  January 29, 2014  FINDINGS: There is a small area of infiltrate in the right middle lobe, better seen on the lateral view. Elsewhere lungs are clear. Heart size and pulmonary vascularity are normal. No adenopathy. No pneumothorax. No bone lesions.  IMPRESSION: Infiltrate in right middle lobe, better seen on lateral view.   Electronically Signed   By: Bretta Bang M.D.   On: 02/06/2014 16:51     EKG Interpretation None      Date: 02/07/2014  Rate: 101  Rhythm: normal sinus rhythm  QRS Axis: normal  Intervals: normal  ST/T Wave abnormalities: normal  Conduction Disutrbances:none  Narrative Interpretation:   Old EKG Reviewed: unchanged EKG analyzed and reviewed by attending physician and this provider.    MDM   Final diagnoses:  CAP (community acquired pneumonia)  Chest pain   Medications  aspirin tablet 325 mg (325 mg Oral Given 02/06/14 2339)  sodium chloride 0.9 % bolus 1,000 mL (0 mLs Intravenous Stopped 02/07/14 0109)   Filed Vitals:   02/06/14 2054 02/06/14 2230 02/07/14 0030 02/07/14 0109  BP: 136/85 139/87 138/94 129/89  Pulse: 76 68 73   Temp: 98.3 F (36.8 C)     TempSrc: Oral     Resp: 19   17  SpO2: 98% 98% 98% 98%    Patient presenting to the ED with chest pain that has been ongoing intermittently throughout the past week - patient reported that this pain is localized to the center of the chest described as a chest tightness, pressure sensation that lasts approximately 1-2 hours. Stated that she has associated light-headedness, nausea. Reported that this afternoon she experienced numbness in her left arm. Stated that she has taken nothing for the pain.  This provider reviewed the patient's chart. Patient was seen and assessed in the ED setting on 01/29/2014 where EKG noted normal sinus. Patient had troponin performed with negative elevation with unremarkable findings on the chest xray. Patient reported that she had a cardiac cath performed in 2004 that was negative.  Alert and oriented. GCS 15. Heart rate and rhythm normal. Lungs clear to auscultation to upper and lower lobes bilaterally. Cap refill less than 3 seconds. Negative swelling or pitting edema identified. Radial and DP pulses 2+ bilaterally. Negative pain upon palpation to the chest wall. Full range of motion to upper and lower extremities bilaterally.  Strength intact with equal distribution-equal grip strength noted. Sensation intact with differentiation sharp and dull touch to left upper extremity. EKG noted mild sinus tachycardia with a heart rate of 101 beats per minute. First I stat troponin, negative elevation. Second i stat troponin negative elevation. CBC negative elevated white blood cell count. BMP negative findings. Chest x-ray noted infiltrate in right middle lobe.  HEART score 0. PERC score low- doubt PE. Patient presenting to the ED with chest pain that has been ongoing for the past week. Patient has pneumonia noted on the chest xray. Patient stable, afebrile. Negative signs of respiratory distress - negative hypoxia noted. Discussed case with attending physician who agreed to discharge. Patient discharged with antibiotics. Referred patient to Dr. Daleen SquibbWall and PCP. Discussed with patient to rest and stay hydrated. Discussed with patient to closely monitor symptoms and if symptoms are to worsen or change to report back to the ED - strict return instructions given.  Patient agreed to plan of care, understood, all questions answered.   Raymon MuttonMarissa Jamariya Davidoff, PA-C 02/07/14 1349

## 2014-02-07 LAB — I-STAT TROPONIN, ED: Troponin i, poc: 0 ng/mL (ref 0.00–0.08)

## 2014-02-07 MED ORDER — AZITHROMYCIN 250 MG PO TABS: 250.0000 mg | ORAL_TABLET | Freq: Every day | ORAL | Status: DC

## 2014-02-07 MED ORDER — AZITHROMYCIN 250 MG PO TABS: 250.0000 mg | ORAL_TABLET | Freq: Once | ORAL | Status: DC

## 2014-02-07 NOTE — ED Notes (Signed)
Pt ambulated in hall- 02 91%, HR 110.  Denies shortness of breath with ambulation.

## 2014-02-07 NOTE — Discharge Instructions (Signed)
Please call your doctor for a followup appointment within 24-48 hours. When you talk to your doctor please let them know that you were seen in the emergency department and have them acquire all of your records so that they can discuss the findings with you and formulate a treatment plan to fully care for your new and ongoing problems. Please call and set-up an appointment with your primary care provider to be seen and re-assessed within the next 24-48 hours Please rest and stay hydrated Please take antibiotics as prescribed Please call and set-up an appointment with Dr. Daleen Squibb regarding ongoing chest pain  Please continue to monitor symptoms and if symptoms are to worsen or change (fever greater than 101, chills, chest pain, shortness of breath, difficulty breathing, numbness, tingling, weakness, dizziness, fainting, nausea, vomiting, diarrhea, blood in the stools, coughing up blood) please report back to the ED immediately    Chest Pain (Nonspecific) It is often hard to give a specific diagnosis for the cause of chest pain. There is always a chance that your pain could be related to something serious, such as a heart attack or a blood clot in the lungs. You need to follow up with your caregiver for further evaluation. CAUSES   Heartburn.  Pneumonia or bronchitis.  Anxiety or stress.  Inflammation around your heart (pericarditis) or lung (pleuritis or pleurisy).  A blood clot in the lung.  A collapsed lung (pneumothorax). It can develop suddenly on its own (spontaneous pneumothorax) or from injury (trauma) to the chest.  Shingles infection (herpes zoster virus). The chest wall is composed of bones, muscles, and cartilage. Any of these can be the source of the pain.  The bones can be bruised by injury.  The muscles or cartilage can be strained by coughing or overwork.  The cartilage can be affected by inflammation and become sore (costochondritis). DIAGNOSIS  Lab tests or other studies,  such as X-rays, electrocardiography, stress testing, or cardiac imaging, may be needed to find the cause of your pain.  TREATMENT   Treatment depends on what may be causing your chest pain. Treatment may include:  Acid blockers for heartburn.  Anti-inflammatory medicine.  Pain medicine for inflammatory conditions.  Antibiotics if an infection is present.  You may be advised to change lifestyle habits. This includes stopping smoking and avoiding alcohol, caffeine, and chocolate.  You may be advised to keep your head raised (elevated) when sleeping. This reduces the chance of acid going backward from your stomach into your esophagus.  Most of the time, nonspecific chest pain will improve within 2 to 3 days with rest and mild pain medicine. HOME CARE INSTRUCTIONS   If antibiotics were prescribed, take your antibiotics as directed. Finish them even if you start to feel better.  For the next few days, avoid physical activities that bring on chest pain. Continue physical activities as directed.  Do not smoke.  Avoid drinking alcohol.  Only take over-the-counter or prescription medicine for pain, discomfort, or fever as directed by your caregiver.  Follow your caregiver's suggestions for further testing if your chest pain does not go away.  Keep any follow-up appointments you made. If you do not go to an appointment, you could develop lasting (chronic) problems with pain. If there is any problem keeping an appointment, you must call to reschedule. SEEK MEDICAL CARE IF:   You think you are having problems from the medicine you are taking. Read your medicine instructions carefully.  Your chest pain does not go  away, even after treatment.  You develop a rash with blisters on your chest. SEEK IMMEDIATE MEDICAL CARE IF:   You have increased chest pain or pain that spreads to your arm, neck, jaw, back, or abdomen.  You develop shortness of breath, an increasing cough, or you are  coughing up blood.  You have severe back or abdominal pain, feel nauseous, or vomit.  You develop severe weakness, fainting, or chills.  You have a fever. THIS IS AN EMERGENCY. Do not wait to see if the pain will go away. Get medical help at once. Call your local emergency services (911 in U.S.). Do not drive yourself to the hospital. MAKE SURE YOU:   Understand these instructions.  Will watch your condition.  Will get help right away if you are not doing well or get worse. Document Released: 08/24/2005 Document Revised: 02/06/2012 Document Reviewed: 06/19/2008 [facility] Patient Information 2014 [place], [place]. Pneumonia, Adult Pneumonia is an infection of the lungs.  CAUSES Pneumonia may be caused by bacteria or a virus. Usually, these infections are caused by breathing infectious particles into the lungs (respiratory tract). SYMPTOMS   Cough.  Fever.  Chest pain.  Increased rate of breathing.  Wheezing.  Mucus production. DIAGNOSIS  If you have the common symptoms of pneumonia, your caregiver will typically confirm the diagnosis with a chest X-ray. The X-ray will show an abnormality in the lung (pulmonary infiltrate) if you have pneumonia. Other tests of your blood, urine, or sputum may be done to find the specific cause of your pneumonia. Your caregiver may also do tests (blood gases or pulse oximetry) to see how well your lungs are working. TREATMENT  Some forms of pneumonia may be spread to other people when you cough or sneeze. You may be asked to wear a mask before and during your exam. Pneumonia that is caused by bacteria is treated with antibiotic medicine. Pneumonia that is caused by the influenza virus may be treated with an antiviral medicine. Most other viral infections must run their course. These infections will not respond to antibiotics.  PREVENTION A pneumococcal shot (vaccine) is available to prevent a common bacterial cause of pneumonia. This is usually  suggested for:  People over 55 years old.  Patients on chemotherapy.  People with chronic lung problems, such as bronchitis or emphysema.  People with immune system problems. If you are over 65 or have a high risk condition, you may receive the pneumococcal vaccine if you have not received it before. In some countries, a routine influenza vaccine is also recommended. This vaccine can help prevent some cases of pneumonia.You may be offered the influenza vaccine as part of your care. If you smoke, it is time to quit. You may receive instructions on how to stop smoking. Your caregiver can provide medicines and counseling to help you quit. HOME CARE INSTRUCTIONS   Cough suppressants may be used if you are losing too much rest. However, coughing protects you by clearing your lungs. You should avoid using cough suppressants if you can.  Your caregiver may have prescribed medicine if he or she thinks your pneumonia is caused by a bacteria or influenza. Finish your medicine even if you start to feel better.  Your caregiver may also prescribe an expectorant. This loosens the mucus to be coughed up.  Only take over-the-counter or prescription medicines for pain, discomfort, or fever as directed by your caregiver.  Do not smoke. Smoking is a common cause of bronchitis and can contribute to pneumonia.  If you are a smoker and continue to smoke, your cough may last several weeks after your pneumonia has cleared.  A cold steam vaporizer or humidifier in your room or home may help loosen mucus.  Coughing is often worse at night. Sleeping in a semi-upright position in a recliner or using a couple pillows under your head will help with this.  Get rest as you feel it is needed. Your body will usually let you know when you need to rest. SEEK IMMEDIATE MEDICAL CARE IF:   Your illness becomes worse. This is especially true if you are elderly or weakened from any other disease.  You cannot control your  cough with suppressants and are losing sleep.  You begin coughing up blood.  You develop pain which is getting worse or is uncontrolled with medicines.  You have a fever.  Any of the symptoms which initially brought you in for treatment are getting worse rather than better.  You develop shortness of breath or chest pain. MAKE SURE YOU:   Understand these instructions.  Will watch your condition.  Will get help right away if you are not doing well or get worse. Document Released: 11/14/2005 Document Revised: 02/06/2012 Document Reviewed: 02/03/2011 [facility] Patient Information 2014 [place].   Emergency Department Resource Guide 1) Find a Doctor and Pay Out of Pocket Although you won't have to find out who is covered by your insurance plan, it is a good idea to ask around and get recommendations. You will then need to call the office and see if the doctor you have chosen will accept you as a new patient and what types of options they offer for patients who are self-pay. Some doctors offer discounts or will set up payment plans for their patients who do not have insurance, but you will need to ask so you aren't surprised when you get to your appointment.  2) Contact Your Local Health Department Not all health departments have doctors that can see patients for sick visits, but many do, so it is worth a call to see if yours does. If you don't know where your local health department is, you can check in your phone book. The CDC also has a tool to help you locate your state's health department, and many state websites also have listings of all of their local health departments.  3) Find a Walk-in Clinic If your illness is not likely to be very severe or complicated, you may want to try a walk in clinic. These are popping up all over the country in pharmacies, drugstores, and shopping centers. They're usually staffed by nurse practitioners or physician assistants that have been trained  to treat common illnesses and complaints. They're usually fairly quick and inexpensive. However, if you have serious medical issues or chronic medical problems, these are probably not your best option.  No Primary Care Doctor: - Call Health Connect at  [phone] - they can help you locate a primary care doctor that  accepts your insurance, provides certain services, etc. - Physician Referral Service- [phone]  Chronic Pain Problems: Organization         Address  Phone   Notes  [facility]  [phone] Patients need to be referred by their primary care doctor.   Medication Assistance: Organization         Address  Phone   Notes  [facility] [place] [facility] [address] [place], [place] [phone] --Must  be a resident of [facility] -- Must have NO insurance coverage whatsoever (no Medicaid/ Medicare, etc.) -- The pt. MUST have a primary care doctor that directs their care regularly and follows them in the community   [facility]  [phone]   [employer]  [phone]    Agencies that provide inexpensive medical care: Organization         Address  Phone   Notes  [facility]  [phone]   [facility]    [phone]   [facility] [address] [place], [place] [address] [phone]   [facility] [address] [place] [phone]   Planned Parenthood    [phone]   [facility]    [phone]   [facility]  [address], [place] Phone:  [phone], Fax:  [phone] Hours of Operation:  9 am - 6 pm, M-F.  Also accepts Medicaid/Medicare and self-pay.  [facility]  [address], [place] Phone: [phone], Fax: [phone]. Hours of Operation:  8:30 am - 5:30 pm, M-F.  Also accepts Medicaid and self-pay.   [facility] [place] [address], [place] Phone: [phone]   [facility] [address] Natasha Bence [place], [place] [phone], Ext. [phone] Mondays & Thursdays: 7-9 AM.  First 15 patients are seen on a first come, first serve basis.    Medicaid-accepting [facility] Providers:  Organization         Address  Phone   Notes  [facility] [address], [place] [phone] Also accepts self-pay patients.  [facility] [address] Laurell Josephs [place], [place]  [phone]   [facility] [address], [place] [phone]   [facility] [address], [place] [phone]   Renaye Rakers [address], [place]   [phone] Only accepts [place] Access [place] patients after they have their name applied to their card.   Self-Pay (no insurance) in [facility]:  Organization         Address  Phone   Notes  Sickle Cell Patients, [facility] Internal Medicine [address] [place], [place] [phone]   [facility] [address] [place], [place] [phone]   [facility]  [address], [place] [phone]   [facility]/Dr. Osei-Bonsu  [address], [place] or [address], [place] [phone] Phone number for both [place] and [place] locations is the same.  [facility]  [facility] [address], [place] [phone]   [facility] [address], [place] or [address] [phone]   [facility] [address] [place] [phone], phone; [phone], fax Sees patients 1st and 3rd Saturday of every month.  Must not qualify for public or private insurance (i.e. Medicaid, Medicare,  Health Choice, Veterans' Benefits)  Household income should be no  more than 200% of the poverty level The clinic cannot treat you if you are pregnant or think you are pregnant  Sexually transmitted diseases are not treated at the clinic.    Dental Care: Organization  Address  Phone  Notes  [facility] Department of [facility] [address] [place], [place] [phone] Accepts children up to age 44 who are enrolled in [place] or [place] Health Choice; pregnant women with a Medicaid card; and children who have applied for Medicaid or [place] Health Choice, but were declined, whose parents can pay a reduced fee at time of service.  [facility] Department of [facility]  [address], [place] [phone] Accepts children up to age 84 who are enrolled in [place] or [place] Health Choice; pregnant women with a Medicaid card; and children who have applied for Medicaid or [place] Health Choice, but were declined, whose parents can pay a reduced fee at time of service.  [facility]  [address] [place], [place] [phone] Patients are seen by appointment only. Walk-ins are not accepted. [facility] will see patients 76 years of age and older. Monday - Tuesday (8am-5pm) Most Wednesdays (8:30-5pm) $30 per visit, cash only  [facility]  [address], [facility] [phone] Patients are seen by appointment only. Walk-ins are not accepted. [facility] will see patients 100 years of age and older. One Wednesday Evening (Monthly: Volunteer Based).  $30 per visit, cash only  [employer]  [phone] for adults; Children under age 68, call Graduate Pediatric Dentistry at [phone]. Children aged 20-14, please call [phone] to request a pediatric application.  Dental services are provided in all areas of dental care including fillings, crowns and bridges, complete and partial dentures, implants, gum treatment, root canals,  and extractions. Preventive care is also provided. Treatment is provided to both adults and children. Patients are selected via a lottery and there is often a waiting list.   [facility] [address], [place]  [phone] [email or web address]   [facility] [address] [place], [place] [phone], Ext. [phone] Second and Fourth Thursday of each month, opens at 6:30 AM; Clinic ends at 9 AM.  Patients are seen on a first-come first-served basis, and a limited number are seen during each clinic.   [facility]  [address] Ether Griffins [place], [place] [phone]   Eligibility Requirements You must have lived in [place], [place], or [place] counties for at least the last three months.   You cannot be eligible for state or federal sponsored [place], including [employer], [place], or [employer].   You generally cannot be eligible for healthcare insurance through your employer.    How to apply: Eligibility screenings are held every Tuesday and Wednesday afternoon from 1:00 pm until 4:00 pm. You do not need an appointment for the interview!  [facility] [address] [place], [place] [phone]   [facility] Health Department  [phone]   [facility] Health Department  [phone]   [facility] Health Department  [phone]    Behavioral Health Resources in the Community: Intensive Outpatient Programs Organization         Address  Phone  Notes  [facility] Services [address], [place], [place] [phone]   [facility] Outpatient [address], [place], [place] [phone]   [facility] [address], [place], [place]  [phone]   [facility] Mental [facility] [address],  [place], [place] [phone] or [phone]   Substance Abuse Resources Organization  Address  Phone  Notes  [facility]  [phone]    [facility]  [phone]   [facility] [place]  [phone]   Floydene Flock  [phone]   Residential & Outpatient Substance Abuse Program  [phone]   Psychological Services Organization         Address  Phone  Notes  [facility] Behavior[facility] Health  [phone]   [facility] Services  [phone]   [facility] [address] [place] [phone] or [phone]    Mobile Crisis Teams Organization         Address  Phone  Notes  [facility], Mobile Crisis Care Unit  [phone]   [facility]  [address] [place], [place] [phone]   Doristine Locks [address] [place] [phone]    Self-Help/Support Groups Organization         Address  Phone             Notes  [facility] [place] - variety of support groups  336- I7437963 Call for more information  Narcotics Anonymous (NA), [facility] [address], [employer] [place]  2 meetings at this location   Statistician         Address  Phone  Notes  [facility] [address],    [place]  [phone]   [facility]  [address] [place] [address], [place], [place] [phone]   [facility] [address] [place], [place] [phone] Admissions: 8am-3pm M-F  [facility] [address]    [place], [place] [phone]   [facility] [address] [place], [place], [place] [phone]   [facility] [address]  [place], [place] [phone]   [facility] - Intensive Outpatient [address] Laurell Josephs [address], [place], [place] [phone]   [facility] ([facility] [address] [place]  [place], [place] [phone] or [phone]   [facility] ([facility]) [address] [place], [place] [phone] Accepts Medicaid  [facility] [address],    [place] [phone] Substance Abuse/Addiction Treatment   [facility] Organization         Address  Phone  Notes  [facility]  [phone]   Angie Fava, PhD [address] Ervin Knack [place], [place]   [phone] or [phone]   [facility] Behavioral   [address] [place], [place] [phone]   [facility] [address], [place], [place] [phone] Insurance/Medicaid/sponsorship through [facility] and Families [address]                                    [place], [place] [phone] Therapy/tele-psych/case  [facility] [address], [place] [phone]    Dr. Lolly Mustache  [phone]   [facility]  [employer] [facility] Dept. 1) [address], [place] 2) [address] [place] 3)  [address], [place] [phone]  [phone]   [facility] Child Abuse Hotline 562-465-9971 or [phone] (After Hours)

## 2014-02-08 NOTE — ED Provider Notes (Signed)
Medical screening examination/treatment/procedure(s) were performed by non-physician practitioner and as supervising physician I was immediately available for consultation/collaboration.   EKG Interpretation None       Olivia Mackielga M Argenis Kumari, MD 02/08/14 [id number]

## 2014-05-16 ENCOUNTER — Emergency Department (HOSPITAL_COMMUNITY): Payer: Self-pay

## 2014-05-16 ENCOUNTER — Encounter (HOSPITAL_COMMUNITY): Payer: Self-pay | Admitting: Emergency Medicine

## 2014-05-16 ENCOUNTER — Emergency Department (HOSPITAL_COMMUNITY)
Admission: EM | Admit: 2014-05-16 | Discharge: 2014-05-17 | Disposition: A | Discharge status: Home / Self Care | Payer: Self-pay | Attending: Emergency Medicine | Admitting: Emergency Medicine

## 2014-05-16 DIAGNOSIS — Z791 Long term (current) use of non-steroidal anti-inflammatories (NSAID): Secondary | ICD-10-CM | POA: Insufficient documentation

## 2014-05-16 DIAGNOSIS — R079 Chest pain, unspecified: Secondary | ICD-10-CM

## 2014-05-16 DIAGNOSIS — R072 Precordial pain: Secondary | ICD-10-CM | POA: Insufficient documentation

## 2014-05-16 DIAGNOSIS — Z880 Allergy status to penicillin: Secondary | ICD-10-CM | POA: Insufficient documentation

## 2014-05-16 DIAGNOSIS — F17200 Nicotine dependence, unspecified, uncomplicated: Secondary | ICD-10-CM | POA: Insufficient documentation

## 2014-05-16 DIAGNOSIS — K219 Gastro-esophageal reflux disease without esophagitis: Secondary | ICD-10-CM | POA: Insufficient documentation

## 2014-05-16 DIAGNOSIS — I10 Essential (primary) hypertension: Secondary | ICD-10-CM | POA: Insufficient documentation

## 2014-05-16 DIAGNOSIS — R0789 Other chest pain: Secondary | ICD-10-CM

## 2014-05-16 DIAGNOSIS — Z792 Long term (current) use of antibiotics: Secondary | ICD-10-CM | POA: Insufficient documentation

## 2014-05-16 HISTORY — DX: Gastro-esophageal reflux disease without esophagitis: K21.9

## 2014-05-16 HISTORY — DX: Essential (primary) hypertension: I10

## 2014-05-16 MED ORDER — ASPIRIN 81 MG PO CHEW
324.0000 mg | CHEWABLE_TABLET | Freq: Once | ORAL | Status: AC
  Administered 2014-05-16: 324 mg via ORAL
  Filled 2014-05-16: qty 4

## 2014-05-16 NOTE — ED Notes (Signed)
Patient complaining of chest pain radiating into left shoulder and left jaw starting tonight at 2130.

## 2014-05-16 NOTE — ED Provider Notes (Signed)
CSN: [id number]     Arrival date & time 05/16/14  2237 History   None    Chief Complaint  Patient presents with  . Chest Pain     (Consider location/radiation/quality/duration/timing/severity/associated sxs/prior Treatment) HPI Comments: Patient states that her chest pain started at about 9:30 tonight. The pain is in the substernal area and radiates to the left shoulder and left jaw. The pain has been mostly continuous, and continues even into the emergency department visit. Patient had a similar episode in March of 2015, it was found to be pneumonia. No recent injury or trauma to the chest. Patient denies the use of any street drugs. There's been no injury or trauma to the chest.  Patient is a 33 y.o. female presenting with chest pain. The history is provided by the patient.  Chest Pain Pain location:  Substernal area Pain quality: sharp   Pain radiates to:  L jaw and L shoulder Pain radiates to the back: no   Pain severity:  Moderate Onset quality:  Sudden Duration:  1 hour Timing:  Intermittent Chronicity:  Recurrent Context: no drug use, not lifting and no movement   Relieved by:  Nothing Ineffective treatments:  None tried Associated symptoms: no abdominal pain, no altered mental status, no back pain, no cough, no dizziness, no fever, no nausea, no palpitations, no PND, no shortness of breath and no syncope   Risk factors: hypertension and smoking   Risk factors: no birth control, no diabetes mellitus and no high cholesterol     Past Medical History  Diagnosis Date  . Hypertension   . Acid reflux    Past Surgical History  Procedure Laterality Date  . Cholecystectomy    . Hand surgery    . Cardiac catherization     History reviewed. No pertinent family history. History  Substance Use Topics  . Smoking status: Current Every Day Smoker -- 1.00 packs/day    Types: Cigarettes  . Smokeless tobacco: Not on file  . Alcohol Use: No   OB History   Grav Para Term Preterm  Abortions TAB SAB Ect Mult Living                 Review of Systems  Constitutional: Negative for fever and activity change.       All ROS Neg except as noted in HPI  HENT: Negative for nosebleeds.   Eyes: Negative for photophobia and discharge.  Respiratory: Negative for cough, shortness of breath and wheezing.   Cardiovascular: Positive for chest pain. Negative for palpitations, syncope and PND.  Gastrointestinal: Negative for nausea, abdominal pain and blood in stool.  Genitourinary: Negative for dysuria, frequency and hematuria.  Musculoskeletal: Negative for arthralgias, back pain and neck pain.  Skin: Negative.   Neurological: Negative for dizziness, seizures and speech difficulty.  Psychiatric/Behavioral: Negative for hallucinations and confusion.      Allergies  Amoxicillin  Home Medications   Prior to Admission medications   Medication Sig Start Date End Date Taking? Authorizing Provider  azithromycin (ZITHROMAX) 250 MG tablet Take 1 tablet (250 mg total) by mouth daily. Take first 2 tablets together, then 1 every day until finished. 02/07/14   Marissa Sciacca, PA-C  HYDROcodone-acetaminophen (NORCO) 10-325 MG per tablet Take 1 tablet by mouth daily as needed for moderate pain.    Historical Provider, MD  ibuprofen (ADVIL,MOTRIN) 200 MG tablet Take 600 mg by mouth daily as needed for headache.     Historical Provider, MD  naproxen sodium (ANAPROX) 220  MG tablet Take 440 mg by mouth daily as needed (pain).    Historical Provider, MD  omeprazole (PRILOSEC) 20 MG capsule Take 20 mg by mouth 2 (two) times daily before a meal.    Historical Provider, MD  Pseudoephedrine-Acetaminophen (SINUS PAIN/PRESSURE PO) Take 2 tablets by mouth once as needed (for symptoms).    Historical Provider, MD   BP 123/79  Pulse 86  Temp(Src) 98.1 F (36.7 C) (Oral)  Resp 16  Ht 5\' 4"  (1.626 m)  Wt 180 lb (81.647 kg)  BMI 30.88 kg/m2  SpO2 99%  LMP 05/15/2014 Physical Exam  Nursing note  and vitals reviewed. Constitutional: She is oriented to person, place, and time. She appears well-developed and well-nourished.  Non-toxic appearance.  HENT:  Head: Normocephalic.  Right Ear: Tympanic membrane and external ear normal.  Left Ear: Tympanic membrane and external ear normal.  Eyes: EOM and lids are normal. Pupils are equal, round, and reactive to light.  Neck: Normal range of motion. Neck supple. Carotid bruit is not present.  Cardiovascular: Normal rate, regular rhythm, normal heart sounds, intact distal pulses and normal pulses.  Exam reveals no gallop and no friction rub.   No murmur heard. Pulmonary/Chest: Breath sounds normal. No respiratory distress. She has no wheezes. She has no rales. She exhibits tenderness.  Moderate lower sternal pain to palpation.  Abdominal: Soft. Bowel sounds are normal. There is no tenderness. There is no guarding.  Musculoskeletal: Normal range of motion. She exhibits no edema and no tenderness.  Neg Homan's Sign.  Lymphadenopathy:       Head (right side): No submandibular adenopathy present.       Head (left side): No submandibular adenopathy present.    She has no cervical adenopathy.  Neurological: She is alert and oriented to person, place, and time. She has normal strength. No cranial nerve deficit or sensory deficit.  Skin: Skin is warm and dry.  Psychiatric: She has a normal mood and affect. Her speech is normal.    ED Course  Procedures (including critical care time) Labs Review Labs Reviewed - No data to display  Imaging Review No results found.   EKG Interpretation None      MDM Vital signs are well within normal limits. Pulse oximetry is 99% on room. Within normal limits by my interpretation.  The electrocardiogram showed a sinus arrhythmia, no acute event noted. Recheck of the patient reveals patient to be feeling some better with improvement of the shoulder jaw and chest area pain.  The urinalysis was  noncontributory. Comprehensive metabolic panel showed a potassium be slightly low at 3.6, otherwise within normal limits.  The complete blood count showed the hemoglobin be slightly low at 1, otherwise within normal limits. 1.6 the hematocrit slightly low at 3Portable chest x-ray showed no acute cardiopulmonary problems. Troponin is negative at less than 0.30. Lipase is normal at 34.  There is no temperature elevation. The chest x-ray is negative for any acute event. The patient has not had any recent injury or trauma. The patient is PERC neg. have low suspicion for venous thrombus problems. The troponin is negative as well as the electrocardiogram for acute cardiac event. The lipase is normal.  I have  discuss with patient that her test results favor a non-acute situation at this time. I've encouraged her to see her primary physician in the office to continue the cardiopulmonary workup. I further encouraged her to return to the emergency department immediately if any changes, problems, or concerns.  Final diagnoses:  None    *I have reviewed nursing notes, vital signs, and all appropriate lab and imaging results for this patient.Kathie Dike, PA-C 05/17/14 [phone]

## 2014-05-17 LAB — CBC WITH DIFFERENTIAL/PLATELET
BASOS ABS: 0 10*3/uL (ref 0.0–0.1)
BASOS PCT: 0 % (ref 0–1)
Eosinophils Absolute: 0.2 10*3/uL (ref 0.0–0.7)
Eosinophils Relative: 4 % (ref 0–5)
HCT: 34.2 % — ABNORMAL LOW (ref 36.0–46.0)
HEMOGLOBIN: 11.6 g/dL — AB (ref 12.0–15.0)
Lymphocytes Relative: 41 % (ref 12–46)
Lymphs Abs: 2.4 10*3/uL (ref 0.7–4.0)
MCH: 30.5 pg (ref 26.0–34.0)
MCHC: 33.9 g/dL (ref 30.0–36.0)
MCV: 90 fL (ref 78.0–100.0)
Monocytes Absolute: 0.3 10*3/uL (ref 0.1–1.0)
Monocytes Relative: 4 % (ref 3–12)
NEUTROS ABS: 2.9 10*3/uL (ref 1.7–7.7)
NEUTROS PCT: 51 % (ref 43–77)
Platelets: 265 10*3/uL (ref 150–400)
RBC: 3.8 MIL/uL — ABNORMAL LOW (ref 3.87–5.11)
RDW: 15.8 % — AB (ref 11.5–15.5)
WBC: 5.8 10*3/uL (ref 4.0–10.5)

## 2014-05-17 LAB — COMPREHENSIVE METABOLIC PANEL
ALK PHOS: 73 U/L (ref 39–117)
ALT: 17 U/L (ref 0–35)
AST: 17 U/L (ref 0–37)
Albumin: 3 g/dL — ABNORMAL LOW (ref 3.5–5.2)
BUN: 8 mg/dL (ref 6–23)
CO2: 26 mEq/L (ref 19–32)
CREATININE: 0.81 mg/dL (ref 0.50–1.10)
Calcium: 8.7 mg/dL (ref 8.4–10.5)
Chloride: 101 mEq/L (ref 96–112)
GFR calc non Af Amer: >90 mL/min (ref >90)
GLUCOSE: 95 mg/dL (ref 70–99)
POTASSIUM: 3.6 meq/L — AB (ref 3.7–5.3)
Sodium: 140 mEq/L (ref 137–147)
TOTAL PROTEIN: 6.6 g/dL (ref 6.0–8.3)
Total Bilirubin: 0.2 mg/dL — ABNORMAL LOW (ref 0.3–1.2)

## 2014-05-17 LAB — URINALYSIS, ROUTINE W REFLEX MICROSCOPIC
Bilirubin Urine: NEGATIVE
Glucose, UA: NEGATIVE mg/dL
KETONES UR: NEGATIVE mg/dL
LEUKOCYTES UA: NEGATIVE
Nitrite: NEGATIVE
PH: 6.5 (ref 5.0–8.0)
PROTEIN: NEGATIVE mg/dL
Specific Gravity, Urine: <1.005 — ABNORMAL LOW (ref 1.005–1.030)
Urobilinogen, UA: 0.2 mg/dL (ref 0.0–1.0)

## 2014-05-17 LAB — URINE MICROSCOPIC-ADD ON

## 2014-05-17 LAB — TROPONIN I: Troponin I: <0.3 ng/mL (ref <0.30)

## 2014-05-17 LAB — LIPASE, BLOOD: Lipase: 34 U/L (ref 11–59)

## 2014-05-17 NOTE — ED Provider Notes (Signed)
Patient seen/examined in the Emergency Department in [facility] with [facility] Patient reports chest pain (brief episodes that last seconds on my evaluation) Exam : awake/alert, no distress, no murmurs noted on cardiac exam Plan: stable for d/c.  I doubt ACS/PE at this time   EKG Interpretation  Date/Time:  Friday May 16 2014 22:49:07 EDT Ventricular Rate:  76 PR Interval:  137 QRS Duration: 95 QT Interval:  376 QTC Calculation: 423 R Axis:   54 Text Interpretation:  Sinus arrhythmia Otherwise within normal limits Confirmed by POLLINA  MD, CHRISTOPHER [phone]([id number]) on 05/16/2014 10:53:11 PM         Joya Gaskinsonald W Wickline, MD 05/17/14 [phone]

## 2014-05-17 NOTE — Discharge Instructions (Signed)
Your labs, x-rays, and EKG are all negative for acute event. Please see your primary physician in the office to complete the workup. Please return to the emergency apartment immediately if any changes, problems, or concerns. Chest Pain (Nonspecific) It is often hard to give a specific diagnosis for the cause of chest pain. There is always a chance that your pain could be related to something serious, such as a heart attack or a blood clot in the lungs. You need to follow up with your health care provider for further evaluation. CAUSES   Heartburn.  Pneumonia or bronchitis.  Anxiety or stress.  Inflammation around your heart (pericarditis) or lung (pleuritis or pleurisy).  A blood clot in the lung.  A collapsed lung (pneumothorax). It can develop suddenly on its own (spontaneous pneumothorax) or from trauma to the chest.  Shingles infection (herpes zoster virus). The chest wall is composed of bones, muscles, and cartilage. Any of these can be the source of the pain.  The bones can be bruised by injury.  The muscles or cartilage can be strained by coughing or overwork.  The cartilage can be affected by inflammation and become sore (costochondritis). DIAGNOSIS  Lab tests or other studies may be needed to find the cause of your pain. Your health care provider may have you take a test called an ambulatory electrocardiogram (ECG). An ECG records your heartbeat patterns over a 24-hour period. You may also have other tests, such as:  Transthoracic echocardiogram (TTE). During echocardiography, sound waves are used to evaluate how blood flows through your heart.  Transesophageal echocardiogram (TEE).  Cardiac monitoring. This allows your health care provider to monitor your heart rate and rhythm in real time.  Holter monitor. This is a portable device that records your heartbeat and can help diagnose heart arrhythmias. It allows your health care provider to track your heart activity for  several days, if needed.  Stress tests by exercise or by giving medicine that makes the heart beat faster. TREATMENT   Treatment depends on what may be causing your chest pain. Treatment may include:  Acid blockers for heartburn.  Anti-inflammatory medicine.  Pain medicine for inflammatory conditions.  Antibiotics if an infection is present.  You may be advised to change lifestyle habits. This includes stopping smoking and avoiding alcohol, caffeine, and chocolate.  You may be advised to keep your head raised (elevated) when sleeping. This reduces the chance of acid going backward from your stomach into your esophagus. Most of the time, nonspecific chest pain will improve within 2-3 days with rest and mild pain medicine.  HOME CARE INSTRUCTIONS   If antibiotics were prescribed, take them as directed. Finish them even if you start to feel better.  For the next few days, avoid physical activities that bring on chest pain. Continue physical activities as directed.  Do not use any tobacco products, including cigarettes, chewing tobacco, or electronic cigarettes.  Avoid drinking alcohol.  Only take medicine as directed by your health care provider.  Follow your health care provider's suggestions for further testing if your chest pain does not go away.  Keep any follow-up appointments you made. If you do not go to an appointment, you could develop lasting (chronic) problems with pain. If there is any problem keeping an appointment, call to reschedule. SEEK MEDICAL CARE IF:   Your chest pain does not go away, even after treatment.  You have a rash with blisters on your chest.  You have a fever. SEEK IMMEDIATE MEDICAL  CARE IF:   You have increased chest pain or pain that spreads to your arm, neck, jaw, back, or abdomen.  You have shortness of breath.  You have an increasing cough, or you cough up blood.  You have severe back or abdominal pain.  You feel nauseous or  vomit.  You have severe weakness.  You faint.  You have chills. This is an emergency. Do not wait to see if the pain will go away. Get medical help at once. Call your local emergency services (911 in [place] Do not drive yourself to the hospital. MAKE SURE YOU:   Understand these instructions.  Will watch your condition.  Will get help right away if you are not doing well or get worse. Document Released: 08/24/2005 Document Revised: 11/19/2013 Document Reviewed: 06/19/2008 [facility] Patient Information 2015 [place], [place]. This information is not intended to replace advice given to you by your health care provider. Make sure you discuss any questions you have with your health care provider.

## 2014-05-17 NOTE — ED Provider Notes (Signed)
Medical screening examination/treatment/procedure(s) were conducted as a shared visit with non-physician practitioner(s) and myself.  I personally evaluated the patient during the encounter.   EKG Interpretation   Date/Time:  Friday May 16 2014 22:49:07 EDT Ventricular Rate:  76 PR Interval:  137 QRS Duration: 95 QT Interval:  376 QTC Calculation: 423 R Axis:   54 Text Interpretation:  Sinus arrhythmia Otherwise within normal limits  Confirmed by POLLINA  MD, CHRISTOPHER [phone]([id number]) on 05/16/2014 10:53:11 PM       On my evaluation pt reported CP was very brief (seconds) and sharp in nature Pt stable/improved for discharge home  Joya Gaskinsonald W Wickline, MD 05/17/14 [phone]

## 2014-07-09 ENCOUNTER — Encounter (HOSPITAL_COMMUNITY): Payer: Self-pay | Admitting: Emergency Medicine

## 2014-07-09 ENCOUNTER — Emergency Department (HOSPITAL_COMMUNITY)
Admission: EM | Admit: 2014-07-09 | Discharge: 2014-07-10 | Disposition: A | Discharge status: Home / Self Care | Payer: Self-pay | Attending: Emergency Medicine | Admitting: Emergency Medicine

## 2014-07-09 DIAGNOSIS — I10 Essential (primary) hypertension: Secondary | ICD-10-CM | POA: Insufficient documentation

## 2014-07-09 DIAGNOSIS — M79609 Pain in unspecified limb: Secondary | ICD-10-CM | POA: Insufficient documentation

## 2014-07-09 DIAGNOSIS — Z880 Allergy status to penicillin: Secondary | ICD-10-CM | POA: Insufficient documentation

## 2014-07-09 DIAGNOSIS — Z9889 Other specified postprocedural states: Secondary | ICD-10-CM | POA: Insufficient documentation

## 2014-07-09 DIAGNOSIS — Z79899 Other long term (current) drug therapy: Secondary | ICD-10-CM | POA: Insufficient documentation

## 2014-07-09 DIAGNOSIS — K219 Gastro-esophageal reflux disease without esophagitis: Secondary | ICD-10-CM | POA: Insufficient documentation

## 2014-07-09 DIAGNOSIS — M79662 Pain in left lower leg: Secondary | ICD-10-CM

## 2014-07-09 DIAGNOSIS — F17200 Nicotine dependence, unspecified, uncomplicated: Secondary | ICD-10-CM | POA: Insufficient documentation

## 2014-07-09 NOTE — ED Notes (Signed)
Pt states she feels a knot on lower left leg, concerned about a blood clot, had 2 aunt with DVT that traveled to brain.

## 2014-07-09 NOTE — ED Notes (Signed)
Left leg pain, having a sharp stabbing pain and I have a knot on it per pt. No known injury per pt.

## 2014-07-09 NOTE — ED Provider Notes (Signed)
CSN: [id number]     Arrival date & time 07/09/14  2119 History   First MD Initiated Contact with Patient 07/09/14 2342     Chief Complaint  Patient presents with  . Leg Pain     (Consider location/radiation/quality/duration/timing/severity/associated sxs/prior Treatment) The history is provided by the patient.   [name] is a 33 y.o. female with hx of cardiac catherization in 2004, who presents to the Emergency Department complaining of left lower leg pain that began suddenly yesterday and has remained constant.  She describes the pain as sharp and and stabbing to the outside of her leg. She also reports noticing a "knot" to her leg earlier today that has since resolved.  She reports walking and standing most of the day during her job.  She states pain is improved with rest.  She is concerned that she may have a blood clot in her leg because she has recently had two family members to be diagnosed with blood clots.  She denies shortness of breath, chest pain, swelling or redness of her leg.   She has not taken any medications tonight.     Past Medical History  Diagnosis Date  . Hypertension   . Acid reflux    Past Surgical History  Procedure Laterality Date  . Cholecystectomy    . Hand surgery    . Cardiac catherization     No family history on file. History  Substance Use Topics  . Smoking status: Current Every Day Smoker -- 1.00 packs/day    Types: Cigarettes  . Smokeless tobacco: Not on file  . Alcohol Use: No   OB History   Grav Para Term Preterm Abortions TAB SAB Ect Mult Living                 Review of Systems  Constitutional: Negative for fever and chills.  Respiratory: Negative for chest tightness and shortness of breath.   Cardiovascular: Negative for chest pain, palpitations and leg swelling.  Gastrointestinal: Negative for nausea and vomiting.  Genitourinary: Negative for dysuria and difficulty urinating.  Musculoskeletal: Positive for myalgias. Negative  for back pain and joint swelling.  Skin: Negative for color change and wound.  Neurological: Negative for dizziness, weakness and numbness.  All other systems reviewed and are negative.     Allergies  Amoxicillin  Home Medications   Prior to Admission medications   Medication Sig Start Date End Date Taking? Authorizing Provider  lisinopril (PRINIVIL,ZESTRIL) 10 MG tablet Take 10 mg by mouth daily.   Yes Historical Provider, MD  omeprazole (PRILOSEC) 20 MG capsule Take 20 mg by mouth 2 (two) times daily before a meal.   Yes Historical Provider, MD   BP 115/80  Pulse 75  Temp(Src) 98.5 F (36.9 C) (Oral)  Resp 20  Ht 5\' 4"  (1.626 m)  Wt 180 lb (81.647 kg)  BMI 30.88 kg/m2  SpO2 100%  LMP 07/06/2014 Physical Exam  Nursing note and vitals reviewed. Constitutional: She is oriented to person, place, and time. She appears well-developed and well-nourished. No distress.  HENT:  Head: Normocephalic and atraumatic.  Cardiovascular: Normal rate, regular rhythm, normal heart sounds and intact distal pulses.   No murmur heard. Pulmonary/Chest: Effort normal and breath sounds normal. No respiratory distress. She exhibits no tenderness.  Musculoskeletal: Normal range of motion. She exhibits tenderness. She exhibits no edema.  Tenderness to palpation of the lateral aspect of the left lower leg.  Negative Homan's sign.  No edema, erythema of the  LE.  DP pulses brisk and symmetrical, distal sensation intact  Neurological: She is alert and oriented to person, place, and time. She exhibits normal muscle tone. Coordination normal.  Skin: Skin is warm and dry. No rash noted. No erythema.    ED Course  Procedures (including critical care time) Labs Review Labs Reviewed - No data to display  Imaging Review No results found.   EKG Interpretation None      MDM   Final diagnoses:  Pain of left lower leg    Patient with left lower leg that began yesterday. Clinical suspicion and exam  findings for DVT are low, but pt concerned because two family members have recently had DVT's , so outpatient [place] scheduled for tomorrow, 07/10/14 at 2:00 pm.  She agrees to close f/u with her PMD, Dr. Regino Schultze    Itali Mckendry L. Hoyte Ziebell, PA-C 07/10/14 0100

## 2014-07-10 ENCOUNTER — Other Ambulatory Visit (HOSPITAL_COMMUNITY): Payer: Self-pay | Admitting: Emergency Medicine

## 2014-07-10 ENCOUNTER — Ambulatory Visit (HOSPITAL_COMMUNITY)
Admit: 2014-07-10 | Discharge: 2014-07-10 | Disposition: A | Discharge status: Home / Self Care | Payer: Self-pay | Source: Ambulatory Visit | Attending: Family Medicine | Admitting: Family Medicine

## 2014-07-10 ENCOUNTER — Encounter (INDEPENDENT_AMBULATORY_CARE_PROVIDER_SITE_OTHER): Payer: Self-pay

## 2014-07-10 DIAGNOSIS — M79609 Pain in unspecified limb: Secondary | ICD-10-CM | POA: Insufficient documentation

## 2014-07-10 DIAGNOSIS — M79662 Pain in left lower leg: Secondary | ICD-10-CM

## 2014-07-10 MED ORDER — HYDROCODONE-ACETAMINOPHEN 5-325 MG PO TABS: ORAL_TABLET | ORAL | Status: DC

## 2014-07-10 MED ORDER — NAPROXEN 500 MG PO TABS: 500.0000 mg | ORAL_TABLET | Freq: Two times a day (BID) | ORAL | Status: DC

## 2014-07-10 NOTE — ED Provider Notes (Signed)
Doppler study of left lower extremity reveals no deep venous thrombosis. This was discussed with the patient.  Donnetta HutchingBrian Chynna Buerkle, MD 07/10/14 [phone]

## 2014-07-10 NOTE — ED Provider Notes (Signed)
Medical screening examination/treatment/procedure(s) were performed by non-physician practitioner and as supervising physician I was immediately available for consultation/collaboration.   EKG Interpretation None        Neri Vieyra, MD 07/10/14 0405 

## 2014-07-10 NOTE — Discharge Instructions (Signed)

## 2015-02-04 ENCOUNTER — Other Ambulatory Visit (HOSPITAL_COMMUNITY): Payer: Self-pay | Admitting: Family

## 2015-02-04 DIAGNOSIS — R102 Pelvic and perineal pain: Secondary | ICD-10-CM

## 2015-02-10 ENCOUNTER — Other Ambulatory Visit (HOSPITAL_COMMUNITY): Payer: Self-pay | Admitting: Family

## 2015-02-10 ENCOUNTER — Ambulatory Visit (HOSPITAL_COMMUNITY)
Admission: RE | Admit: 2015-02-10 | Discharge: 2015-02-10 | Disposition: A | Discharge status: Home / Self Care | Payer: Self-pay | Source: Ambulatory Visit | Attending: Family | Admitting: Family

## 2015-02-10 DIAGNOSIS — R102 Pelvic and perineal pain: Secondary | ICD-10-CM | POA: Insufficient documentation

## 2015-03-04 ENCOUNTER — Other Ambulatory Visit (HOSPITAL_COMMUNITY): Payer: Self-pay | Admitting: Nurse Practitioner

## 2015-03-04 DIAGNOSIS — N83202 Unspecified ovarian cyst, left side: Secondary | ICD-10-CM

## 2015-03-31 ENCOUNTER — Ambulatory Visit (HOSPITAL_COMMUNITY)
Admission: RE | Admit: 2015-03-31 | Discharge: 2015-03-31 | Disposition: A | Discharge status: Home / Self Care | Payer: Self-pay | Source: Ambulatory Visit | Attending: Nurse Practitioner | Admitting: Nurse Practitioner

## 2015-03-31 ENCOUNTER — Ambulatory Visit (HOSPITAL_COMMUNITY): Payer: Self-pay

## 2015-03-31 DIAGNOSIS — N8320 Unspecified ovarian cysts: Secondary | ICD-10-CM | POA: Insufficient documentation

## 2015-03-31 DIAGNOSIS — N83202 Unspecified ovarian cyst, left side: Secondary | ICD-10-CM

## 2015-04-03 ENCOUNTER — Other Ambulatory Visit (HOSPITAL_COMMUNITY): Payer: Self-pay | Admitting: Nurse Practitioner

## 2015-04-03 DIAGNOSIS — N83299 Other ovarian cyst, unspecified side: Secondary | ICD-10-CM

## 2015-05-13 ENCOUNTER — Ambulatory Visit (HOSPITAL_COMMUNITY)
Admission: RE | Admit: 2015-05-13 | Discharge: 2015-05-13 | Disposition: A | Discharge status: Home / Self Care | Payer: Self-pay | Source: Ambulatory Visit | Attending: Nurse Practitioner | Admitting: Nurse Practitioner

## 2015-05-13 ENCOUNTER — Other Ambulatory Visit (HOSPITAL_COMMUNITY): Payer: Self-pay

## 2015-05-13 DIAGNOSIS — N8320 Unspecified ovarian cysts: Secondary | ICD-10-CM | POA: Insufficient documentation

## 2015-05-13 DIAGNOSIS — N83299 Other ovarian cyst, unspecified side: Secondary | ICD-10-CM

## 2015-07-27 ENCOUNTER — Emergency Department (HOSPITAL_COMMUNITY)
Admission: EM | Admit: 2015-07-27 | Discharge: 2015-07-27 | Disposition: A | Discharge status: Home / Self Care | Payer: Self-pay | Attending: Emergency Medicine | Admitting: Emergency Medicine

## 2015-07-27 ENCOUNTER — Encounter (HOSPITAL_COMMUNITY): Payer: Self-pay | Admitting: Cardiology

## 2015-07-27 DIAGNOSIS — K219 Gastro-esophageal reflux disease without esophagitis: Secondary | ICD-10-CM | POA: Insufficient documentation

## 2015-07-27 DIAGNOSIS — Z720 Tobacco use: Secondary | ICD-10-CM | POA: Insufficient documentation

## 2015-07-27 DIAGNOSIS — Z79899 Other long term (current) drug therapy: Secondary | ICD-10-CM | POA: Insufficient documentation

## 2015-07-27 DIAGNOSIS — I10 Essential (primary) hypertension: Secondary | ICD-10-CM | POA: Insufficient documentation

## 2015-07-27 DIAGNOSIS — L509 Urticaria, unspecified: Secondary | ICD-10-CM | POA: Insufficient documentation

## 2015-07-27 DIAGNOSIS — Z880 Allergy status to penicillin: Secondary | ICD-10-CM | POA: Insufficient documentation

## 2015-07-27 MED ORDER — PREDNISONE 10 MG PO TABS: 20.0000 mg | ORAL_TABLET | Freq: Two times a day (BID) | ORAL | Status: DC

## 2015-07-27 NOTE — Discharge Instructions (Signed)
Prednisone as prescribed.  Benadryl 25 mg every 6 hours as needed for itching.  Return to the emergency department if you develop difficulty breathing, swelling of your mouth or throat, or an inability to swallow.   Hives Hives are itchy, red, swollen areas of the skin. They can vary in size and location on your body. Hives can come and go for hours or several days (acute hives) or for several weeks (chronic hives). Hives do not spread from person to person (noncontagious). They may get worse with scratching, exercise, and emotional stress. CAUSES   Allergic reaction to food, additives, or drugs.  Infections, including the common cold.  Illness, such as vasculitis, lupus, or thyroid disease.  Exposure to sunlight, heat, or cold.  Exercise.  Stress.  Contact with chemicals. SYMPTOMS   Red or white swollen patches on the skin. The patches may change size, shape, and location quickly and repeatedly.  Itching.  Swelling of the hands, feet, and face. This may occur if hives develop deeper in the skin. DIAGNOSIS  Your caregiver can usually tell what is wrong by performing a physical exam. Skin or blood tests may also be done to determine the cause of your hives. In some cases, the cause cannot be determined. TREATMENT  Mild cases usually get better with medicines such as antihistamines. Severe cases may require an emergency epinephrine injection. If the cause of your hives is known, treatment includes avoiding that trigger.  HOME CARE INSTRUCTIONS   Avoid causes that trigger your hives.  Take antihistamines as directed by your caregiver to reduce the severity of your hives. Non-sedating or low-sedating antihistamines are usually recommended. Do not drive while taking an antihistamine.  Take any other medicines prescribed for itching as directed by your caregiver.  Wear loose-fitting clothing.  Keep all follow-up appointments as directed by your caregiver. SEEK MEDICAL CARE IF:    You have persistent or severe itching that is not relieved with medicine.  You have painful or swollen joints. SEEK IMMEDIATE MEDICAL CARE IF:   You have a fever.  Your tongue or lips are swollen.  You have trouble breathing or swallowing.  You feel tightness in the throat or chest.  You have abdominal pain. These problems may be the first sign of a life-threatening allergic reaction. Call your local emergency services (911 in [place] MAKE SURE YOU:   Understand these instructions.  Will watch your condition.  Will get help right away if you are not doing well or get worse. Document Released: 11/14/2005 Document Revised: 11/19/2013 Document Reviewed: 02/07/2012 [facility] Patient Information 2015 [place], [place]. This information is not intended to replace advice given to you by your health care provider. Make sure you discuss any questions you have with your health care provider.

## 2015-07-27 NOTE — ED Provider Notes (Signed)
CSN: [id number]     Arrival date & time 07/27/15  0906 History  This chart was scribed for Geoffery Lyons, MD by Andrew Au, ED Scribe. This patient was seen in room APA18/APA18 and the patient's care was started at 9:17 AM.  Chief Complaint  Patient presents with  . Rash   Patient is a 34 y.o. female presenting with rash. The history is provided by the patient. No language interpreter was used.  Rash Location:  Torso, leg and shoulder/arm Quality: itchiness and redness   Severity:  Mild Onset quality:  Sudden Timing:  Constant Progression since onset: comes and goes. Chronicity:  New Context: not animal contact, not food, not insect bite/sting and not new detergent/soap   Relieved by:  None tried Worsened by:  Nothing tried Ineffective treatments:  None tried Associated symptoms: no shortness of breath, no throat swelling, no tongue swelling and not wheezing     HPI Comments:  [name] is a 34 y.o. female who present to the Emergency Department complaining of itchy, erythematous, rash that comes and goes, onset last night. Pt was washing dishes last night when she developed rash to bilateral wrist and abdomen. States she woke up this morning with welts to upper legs and back. Pt denies working in the yard as well as trying new soaps, detergents, lotions and new foods. She denies trouble breathing and swallowing.    Past Medical History  Diagnosis Date  . Hypertension   . Acid reflux    Past Surgical History  Procedure Laterality Date  . Cholecystectomy    . Hand surgery    . Cardiac catherization     History reviewed. No pertinent family history. Social History  Substance Use Topics  . Smoking status: Current Every Day Smoker -- 1.00 packs/day    Types: Cigarettes  . Smokeless tobacco: None  . Alcohol Use: No   OB History    No data available     Review of Systems  Respiratory: Negative for shortness of breath and wheezing.   Skin: Positive for rash.  All  other systems reviewed and are negative.  Allergies  Amoxicillin  Home Medications   Prior to Admission medications   Medication Sig Start Date End Date Taking? Authorizing Provider  HYDROcodone-acetaminophen (NORCO/VICODIN) 5-325 MG per tablet Take one-two tabs po q 4-6 hrs prn pain 07/10/14   Tammy Triplett, PA-C  lisinopril (PRINIVIL,ZESTRIL) 10 MG tablet Take 10 mg by mouth daily.    Historical Provider, MD  naproxen (NAPROSYN) 500 MG tablet Take 1 tablet (500 mg total) by mouth 2 (two) times daily. Take with food 07/10/14   Tammy Triplett, PA-C  omeprazole (PRILOSEC) 20 MG capsule Take 20 mg by mouth 2 (two) times daily before a meal.    Historical Provider, MD   BP 138/104 mmHg  Pulse 76  Temp(Src) 98.1 F (36.7 C) (Oral)  Resp 16  Ht  (1.626 m)  Wt 175 lb (79.379 kg)  BMI 30.02 kg/m2  SpO2 96%  LMP 07/22/2015 Physical Exam  Constitutional: She is oriented to person, place, and time. She appears well-developed and well-nourished. No distress.  HENT:  Head: Normocephalic and atraumatic.  Eyes: Conjunctivae and EOM are normal.  Neck: Neck supple.  Cardiovascular: Normal rate.   Pulmonary/Chest: Effort normal.  Musculoskeletal: Normal range of motion.  Neurological: She is alert and oriented to person, place, and time.  Skin: Skin is warm and dry.  There is a generalized urticarial rash present to the  torso and all 4 extremities   Psychiatric: She has a normal mood and affect. Her behavior is normal.  Nursing note and vitals reviewed.   ED Course  Procedures (including critical care time) DIAGNOSTIC STUDIES: Oxygen Saturation is 96% on RA, normal by my interpretation.    COORDINATION OF CARE: 9:25 AM- Pt advised of plan for treatment and pt agrees.  Labs Review Labs Reviewed - No data to display  Imaging Review No results found. I have personally reviewed and evaluated these images and lab results as part of my medical decision-making.   EKG  Interpretation None      MDM   Final diagnoses:  None    These appear to be urticaria. We'll treat with prednisone and Benadryl. To return as needed for any problems.  I personally performed the services described in this documentation, which was scribed in my presence. The recorded information has been reviewed and is accurate.      Geoffery Lyons, MD 07/27/15 [phone]

## 2015-07-27 NOTE — ED Notes (Signed)
Rash since last night.

## 2015-09-22 ENCOUNTER — Emergency Department (HOSPITAL_COMMUNITY): Payer: Self-pay

## 2015-09-22 ENCOUNTER — Emergency Department (HOSPITAL_COMMUNITY)
Admission: EM | Admit: 2015-09-22 | Discharge: 2015-09-22 | Disposition: A | Discharge status: Home / Self Care | Payer: Self-pay | Attending: Emergency Medicine | Admitting: Emergency Medicine

## 2015-09-22 ENCOUNTER — Encounter (HOSPITAL_COMMUNITY): Payer: Self-pay | Admitting: Emergency Medicine

## 2015-09-22 DIAGNOSIS — R079 Chest pain, unspecified: Secondary | ICD-10-CM | POA: Insufficient documentation

## 2015-09-22 DIAGNOSIS — R05 Cough: Secondary | ICD-10-CM | POA: Insufficient documentation

## 2015-09-22 DIAGNOSIS — Z880 Allergy status to penicillin: Secondary | ICD-10-CM | POA: Insufficient documentation

## 2015-09-22 DIAGNOSIS — R600 Localized edema: Secondary | ICD-10-CM | POA: Insufficient documentation

## 2015-09-22 DIAGNOSIS — I10 Essential (primary) hypertension: Secondary | ICD-10-CM | POA: Insufficient documentation

## 2015-09-22 DIAGNOSIS — Z79899 Other long term (current) drug therapy: Secondary | ICD-10-CM | POA: Insufficient documentation

## 2015-09-22 DIAGNOSIS — Z720 Tobacco use: Secondary | ICD-10-CM | POA: Insufficient documentation

## 2015-09-22 DIAGNOSIS — R42 Dizziness and giddiness: Secondary | ICD-10-CM | POA: Insufficient documentation

## 2015-09-22 DIAGNOSIS — Z7952 Long term (current) use of systemic steroids: Secondary | ICD-10-CM | POA: Insufficient documentation

## 2015-09-22 DIAGNOSIS — Z3202 Encounter for pregnancy test, result negative: Secondary | ICD-10-CM | POA: Insufficient documentation

## 2015-09-22 DIAGNOSIS — R202 Paresthesia of skin: Secondary | ICD-10-CM | POA: Insufficient documentation

## 2015-09-22 DIAGNOSIS — K219 Gastro-esophageal reflux disease without esophagitis: Secondary | ICD-10-CM | POA: Insufficient documentation

## 2015-09-22 DIAGNOSIS — Z9889 Other specified postprocedural states: Secondary | ICD-10-CM | POA: Insufficient documentation

## 2015-09-22 DIAGNOSIS — Z791 Long term (current) use of non-steroidal anti-inflammatories (NSAID): Secondary | ICD-10-CM | POA: Insufficient documentation

## 2015-09-22 DIAGNOSIS — R0602 Shortness of breath: Secondary | ICD-10-CM | POA: Insufficient documentation

## 2015-09-22 LAB — CBC WITH DIFFERENTIAL/PLATELET
BASOS ABS: 0 10*3/uL (ref 0.0–0.1)
BASOS PCT: 0 %
EOS PCT: 2 %
Eosinophils Absolute: 0.1 10*3/uL (ref 0.0–0.7)
HCT: 34.3 % — ABNORMAL LOW (ref 36.0–46.0)
Hemoglobin: 11.6 g/dL — ABNORMAL LOW (ref 12.0–15.0)
Lymphocytes Relative: 38 %
Lymphs Abs: 2.1 10*3/uL (ref 0.7–4.0)
MCH: 30.9 pg (ref 26.0–34.0)
MCHC: 33.8 g/dL (ref 30.0–36.0)
MCV: 91.5 fL (ref 78.0–100.0)
MONO ABS: 0.3 10*3/uL (ref 0.1–1.0)
MONOS PCT: 5 %
Neutro Abs: 3.1 10*3/uL (ref 1.7–7.7)
Neutrophils Relative %: 55 %
PLATELETS: 245 10*3/uL (ref 150–400)
RBC: 3.75 MIL/uL — ABNORMAL LOW (ref 3.87–5.11)
RDW: 15.3 % (ref 11.5–15.5)
WBC: 5.6 10*3/uL (ref 4.0–10.5)

## 2015-09-22 LAB — BASIC METABOLIC PANEL
ANION GAP: 8 (ref 5–15)
BUN: 6 mg/dL (ref 6–20)
CALCIUM: 8.9 mg/dL (ref 8.9–10.3)
CO2: 28 mmol/L (ref 22–32)
CREATININE: 0.63 mg/dL (ref 0.44–1.00)
Chloride: 102 mmol/L (ref 101–111)
GLUCOSE: 109 mg/dL — AB (ref 65–99)
Potassium: 3 mmol/L — ABNORMAL LOW (ref 3.5–5.1)
Sodium: 138 mmol/L (ref 135–145)

## 2015-09-22 LAB — TROPONIN I: Troponin I: <0.03 ng/mL (ref <0.031)

## 2015-09-22 LAB — POC URINE PREG, ED: Preg Test, Ur: NEGATIVE

## 2015-09-22 MED ORDER — POTASSIUM CHLORIDE CRYS ER 20 MEQ PO TBCR
40.0000 meq | EXTENDED_RELEASE_TABLET | Freq: Once | ORAL | Status: AC
  Administered 2015-09-22: 40 meq via ORAL
  Filled 2015-09-22: qty 2

## 2015-09-22 MED ORDER — MORPHINE SULFATE (PF) 4 MG/ML IV SOLN
4.0000 mg | Freq: Once | INTRAVENOUS | Status: DC
  Filled 2015-09-22: qty 1

## 2015-09-22 MED ORDER — ONDANSETRON HCL 4 MG/2ML IJ SOLN
4.0000 mg | Freq: Once | INTRAMUSCULAR | Status: DC
  Filled 2015-09-22: qty 2

## 2015-09-22 NOTE — ED Provider Notes (Signed)
TIME SEEN: 12:31 AM  CHIEF COMPLAINT: Chest pain  HPI:  HPI Comments: [name] is a 34 y.o. 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 She also describes an intermittent nagging chest pain that lasts for a few minutes at onset, also onset tonight while at work. Pt does not recall any modifying factors to her CP. She associates a tingling sensation to ventral aspect of left arm from her elbow to her wrist, SOB, and light-headedness. She additionally notes edema in bilateral feet over the past 4 days that has resolved today. She endorses a similar episode of CP in the past of unspecified etiology. Pt is a current smoker and endorses a mild cough but denies any recent illness. Denies any fever. Cardiac catheterization 11 years ago that was normal. She is unable to tell me why she had a cardiac catheterization at such a young age. Also denies nausea or vomiting or PMhx of diabetes, hyperlipidemia, CAD. PFhx of MI in grandfather at age 85 and strong family history of coronary artery disease. No recent surgeries or hospitalizations or fractures. No PMhx of PE/DVT. Denies recent travel or estrogen use.   PCP: Dr. Sherwood Gambler   ROS: See HPI Constitutional: no fever  Eyes: no drainage  ENT: no runny nose   Cardiovascular:  chest pain  Resp: SOB  GI: no vomiting GU: no dysuria Integumentary: no rash  Allergy: no hives  Musculoskeletal: no leg swelling  Neurological: no slurred speech ROS otherwise negative  PAST MEDICAL HISTORY/PAST SURGICAL HISTORY:  Past Medical History  Diagnosis Date  . Hypertension   . Acid reflux     MEDICATIONS:  Prior to Admission medications   Medication Sig Start Date End Date Taking? Authorizing Provider  HYDROcodone-acetaminophen (NORCO/VICODIN) 5-325 MG per tablet Take  one-two tabs po q 4-6 hrs prn pain 07/10/14   Tammy Triplett, PA-C  lisinopril (PRINIVIL,ZESTRIL) 10 MG tablet Take 10 mg by mouth daily.    Historical Provider, MD  naproxen (NAPROSYN) 500 MG tablet Take 1 tablet (500 mg total) by mouth 2 (two) times daily. Take with food 07/10/14   Tammy Triplett, PA-C  omeprazole (PRILOSEC) 20 MG capsule Take 20 mg by mouth 2 (two) times daily before a meal.    Historical Provider, MD  predniSONE (DELTASONE) 10 MG tablet Take 2 tablets (20 mg total) by mouth 2 (two) times daily. 07/27/15   Geoffery Lyons, MD    ALLERGIES:  Allergies  Allergen Reactions  . Amoxicillin Hives    SOCIAL HISTORY:  Social History  Substance Use Topics  . Smoking status: Current Every Day Smoker -- 1.00 packs/day    Types: Cigarettes  . Smokeless tobacco: Not on file  . Alcohol Use: No    FAMILY HISTORY: No family history on file.  EXAM: BP 175/100 mmHg  Pulse 82  Temp(Src) 97.7 F (36.5 C)  Resp 18  Ht  (1.6 m)  Wt 180 lb (81.647 kg)  BMI 31.89 kg/m2  SpO2 100%  LMP 09/12/2015 CONSTITUTIONAL: Alert and oriented and responds appropriately to questions. Well-appearing; well-nourished HEAD: Normocephalic EYES: Conjunctivae clear, PERRL ENT: normal nose; no rhinorrhea; moist mucous membranes; pharynx without lesions noted NECK: Supple, no meningismus, no LAD  CARD: RRR; S1 and S2 appreciated; no murmurs, no clicks, no rubs, no gallops RESP: Normal  chest excursion without splinting or tachypnea; breath sounds clear and equal bilaterally; no wheezes, no rhonchi, no rales, no hypoxia or respiratory distress, speaking full sentences; chest wall nontender to palpation without crepitus, ecchymosis or deformity ABD/GI: Normal bowel sounds; non-distended; soft, non-tender, no rebound, no guarding BACK:  The back appears normal and is non-tender to palpation, there is no CVA tenderness EXT: Normal ROM in all joints; non-tender to palpation; no edema; normal capillary  refill; no cyanosis; 2+ radial pulses bilaterally    SKIN: Normal color for age and race; warm NEURO: Moves all extremities equally, strength 5/5 in all 4 extremities; sensation to light touch intact diffusely, cranial nerves II through XII intact PSYCH: The patient's mood and manner are appropriate. Grooming and personal hygiene are appropriate.  MEDICAL DECISION MAKING: Patient here with chest pain. She does have risk factors for ACS including hypertension, tobacco use and a strong family history. EKG shows no ischemic changes. She is hypertensive in the emergency department. We'll monitor this closely and reassess after pain has been treated. Complains of tingling in the left arm but has no focal neurologic deficit. Tingling is only from the elbow to the wrist. We'll obtain cardiac labs, chest x-ray. She has no risk factors for pulmonary embolus and is PERC negative.  Doubt dissection. Doubt stroke.   ED PROGRESS: 2:00 AM  Pt's labs are unremarkable (other than slightly low potassium which we will replace) including 1 negative troponin. Chest x-ray clear. She refused pain medication stating that she feels now that this is more likely related to anxiety. Reports she is feeling better. Plan is to repeat second troponin at 3:30 AM. If negative, will discharge home with close outpatient follow-up. She is comfortable with this plan.  4:00 AM  Pt's second troponin is negative. She reports feeling better. I feel she is safe to be discharged home. Doubt that this was ACS. Again doubt pulmonary embolus or dissection. Discussed return precautions are recommended outpatient follow-up. She verbalized understanding and is covered with this plan.     Date: 09/22/2015 00:18  Rate: 69  Rhythm: normal sinus rhythm  QRS Axis: normal  Intervals: normal  ST/T Wave abnormalities: normal  Conduction Disutrbances: none  Narrative Interpretation: unremarkable; no ischemic changes, no interval changes     I  personally performed the services described in this documentation, which was scribed in my presence. The recorded information has been reviewed and is accurate.    Layla MawKristen N Davion Flannery, DO 09/22/15 [id number]

## 2015-09-22 NOTE — ED Notes (Signed)
Patient given discharge instruction, verbalized understand. IV removed, band aid applied. Patient ambulatory out of the department.  

## 2015-09-22 NOTE — Discharge Instructions (Signed)
Nonspecific Chest Pain  °Chest pain can be caused by many different conditions. There is always a chance that your pain could be related to something serious, such as a heart attack or a blood clot in your lungs. Chest pain can also be caused by conditions that are not life-threatening. If you have chest pain, it is very important to follow up with your health care provider. °CAUSES  °Chest pain can be caused by: °· Heartburn. °· Pneumonia or bronchitis. °· Anxiety or stress. °· Inflammation around your heart (pericarditis) or lung (pleuritis or pleurisy). °· A blood clot in your lung. °· A collapsed lung (pneumothorax). It can develop suddenly on its own (spontaneous pneumothorax) or from trauma to the chest. °· Shingles infection (varicella-zoster virus). °· Heart attack. °· Damage to the bones, muscles, and cartilage that make up your chest wall. This can include: °¨ Bruised bones due to injury. °¨ Strained muscles or cartilage due to frequent or repeated coughing or overwork. °¨ Fracture to one or more ribs. °¨ Sore cartilage due to inflammation (costochondritis). °RISK FACTORS  °Risk factors for chest pain may include: °· Activities that increase your risk for trauma or injury to your chest. °· Respiratory infections or conditions that cause frequent coughing. °· Medical conditions or overeating that can cause heartburn. °· Heart disease or family history of heart disease. °· Conditions or health behaviors that increase your risk of developing a blood clot. °· Having had chicken pox (varicella zoster). °SIGNS AND SYMPTOMS °Chest pain can feel like: °· Burning or tingling on the surface of your chest or deep in your chest. °· Crushing, pressure, aching, or squeezing pain. °· Dull or sharp pain that is worse when you move, cough, or take a deep breath. °· Pain that is also felt in your back, neck, shoulder, or arm, or pain that spreads to any of these areas. °Your chest pain may come and go, or it may stay  constant. °DIAGNOSIS °Lab tests or other studies may be needed to find the cause of your pain. Your health care provider may have you take a test called an ambulatory ECG (electrocardiogram). An ECG records your heartbeat patterns at the time the test is performed. You may also have other tests, such as: °· Transthoracic echocardiogram (TTE). During echocardiography, sound waves are used to create a picture of all of the heart structures and to look at how blood flows through your heart. °· Transesophageal echocardiogram (TEE). This is a more advanced imaging test that obtains images from inside your body. It allows your health care provider to see your heart in finer detail. °· Cardiac monitoring. This allows your health care provider to monitor your heart rate and rhythm in real time. °· Holter monitor. This is a portable device that records your heartbeat and can help to diagnose abnormal heartbeats. It allows your health care provider to track your heart activity for several days, if needed. °· Stress tests. These can be done through exercise or by taking medicine that makes your heart beat more quickly. °· Blood tests. °· Imaging tests. °TREATMENT  °Your treatment depends on what is causing your chest pain. Treatment may include: °· Medicines. These may include: °¨ Acid blockers for heartburn. °¨ Anti-inflammatory medicine. °¨ Pain medicine for inflammatory conditions. °¨ Antibiotic medicine, if an infection is present. °¨ Medicines to dissolve blood clots. °¨ Medicines to treat coronary artery disease. °· Supportive care for conditions that do not require medicines. This may include: °¨ Resting. °¨ Applying heat   or cold packs to injured areas. °¨ Limiting activities until pain decreases. °HOME CARE INSTRUCTIONS °· If you were prescribed an antibiotic medicine, finish it all even if you start to feel better. °· Avoid any activities that bring on chest pain. °· Do not use any tobacco products, including  cigarettes, chewing tobacco, or electronic cigarettes. If you need help quitting, ask your health care provider. °· Do not drink alcohol. °· Take medicines only as directed by your health care provider. °· Keep all follow-up visits as directed by your health care provider. This is important. This includes any further testing if your chest pain does not go away. °· If heartburn is the cause for your chest pain, you may be told to keep your head raised (elevated) while sleeping. This reduces the chance that acid will go from your stomach into your esophagus. °· Make lifestyle changes as directed by your health care provider. These may include: °¨ Getting regular exercise. Ask your health care provider to suggest some activities that are safe for you. °¨ Eating a heart-healthy diet. A registered dietitian can help you to learn healthy eating options. °¨ Maintaining a healthy weight. °¨ Managing diabetes, if necessary. °¨ Reducing stress. °SEEK MEDICAL CARE IF: °· Your chest pain does not go away after treatment. °· You have a rash with blisters on your chest. °· You have a fever. °SEEK IMMEDIATE MEDICAL CARE IF:  °· Your chest pain is worse. °· You have an increasing cough, or you cough up blood. °· You have severe abdominal pain. °· You have severe weakness. °· You faint. °· You have chills. °· You have sudden, unexplained chest discomfort. °· You have sudden, unexplained discomfort in your arms, back, neck, or jaw. °· You have shortness of breath at any time. °· You suddenly start to sweat, or your skin gets clammy. °· You feel nauseous or you vomit. °· You suddenly feel light-headed or dizzy. °· Your heart begins to beat quickly, or it feels like it is skipping beats. °These symptoms may represent a serious problem that is an emergency. Do not wait to see if the symptoms will go away. Get medical help right away. Call your local emergency services (911 in the [place] Do not drive yourself to the hospital. °  °This  information is not intended to replace advice given to you by your health care provider. Make sure you discuss any questions you have with your health care provider. °  °Document Released: 08/24/2005 Document Revised: 12/05/2014 Document Reviewed: 06/20/2014 °Elsevier Interactive Patient Education ©2016 [employer] ° °

## 2015-09-22 NOTE — ED Notes (Signed)
Pt states feet swelling x 4 days, onset of chest pain and left arm numbness at work tonight, 2 hours ago, dizziness

## 2015-09-22 NOTE — ED Notes (Signed)
Pt ambulatory to the bathroom 

## 2016-10-04 ENCOUNTER — Other Ambulatory Visit (HOSPITAL_COMMUNITY): Payer: Self-pay | Admitting: Internal Medicine

## 2016-10-04 DIAGNOSIS — M546 Pain in thoracic spine: Secondary | ICD-10-CM

## 2016-10-14 ENCOUNTER — Ambulatory Visit (HOSPITAL_COMMUNITY)
Admission: RE | Admit: 2016-10-14 | Discharge: 2016-10-14 | Disposition: A | Discharge status: Home / Self Care | Payer: BLUE CROSS/BLUE SHIELD | Source: Ambulatory Visit | Attending: Internal Medicine | Admitting: Internal Medicine

## 2016-10-14 DIAGNOSIS — M546 Pain in thoracic spine: Secondary | ICD-10-CM | POA: Diagnosis present

## 2016-10-14 DIAGNOSIS — R1901 Right upper quadrant abdominal swelling, mass and lump: Secondary | ICD-10-CM | POA: Diagnosis not present

## 2017-01-09 DIAGNOSIS — R1909 Other intra-abdominal and pelvic swelling, mass and lump: Secondary | ICD-10-CM | POA: Diagnosis not present

## 2017-01-09 DIAGNOSIS — G894 Chronic pain syndrome: Secondary | ICD-10-CM | POA: Diagnosis not present

## 2017-01-09 DIAGNOSIS — F419 Anxiety disorder, unspecified: Secondary | ICD-10-CM | POA: Diagnosis not present

## 2017-01-09 DIAGNOSIS — Z1389 Encounter for screening for other disorder: Secondary | ICD-10-CM | POA: Diagnosis not present

## 2017-01-09 DIAGNOSIS — Z6828 Body mass index (BMI) 28.0-28.9, adult: Secondary | ICD-10-CM | POA: Diagnosis not present

## 2017-01-09 DIAGNOSIS — M0689 Other specified rheumatoid arthritis, multiple sites: Secondary | ICD-10-CM | POA: Diagnosis not present

## 2017-01-26 DIAGNOSIS — R1901 Right upper quadrant abdominal swelling, mass and lump: Secondary | ICD-10-CM | POA: Diagnosis not present

## 2017-01-31 ENCOUNTER — Other Ambulatory Visit: Payer: Self-pay | Admitting: General Surgery

## 2017-01-31 DIAGNOSIS — R1901 Right upper quadrant abdominal swelling, mass and lump: Secondary | ICD-10-CM

## 2017-02-01 ENCOUNTER — Other Ambulatory Visit: Payer: Self-pay | Admitting: General Surgery

## 2017-02-01 DIAGNOSIS — R1901 Right upper quadrant abdominal swelling, mass and lump: Secondary | ICD-10-CM

## 2017-02-03 DIAGNOSIS — Z720 Tobacco use: Secondary | ICD-10-CM | POA: Diagnosis not present

## 2017-02-09 ENCOUNTER — Other Ambulatory Visit: Payer: BLUE CROSS/BLUE SHIELD

## 2017-02-10 DIAGNOSIS — Z720 Tobacco use: Secondary | ICD-10-CM | POA: Diagnosis not present

## 2017-02-17 DIAGNOSIS — Z720 Tobacco use: Secondary | ICD-10-CM | POA: Diagnosis not present

## 2017-02-20 DIAGNOSIS — F17210 Nicotine dependence, cigarettes, uncomplicated: Secondary | ICD-10-CM | POA: Diagnosis not present

## 2017-02-20 DIAGNOSIS — Z716 Tobacco abuse counseling: Secondary | ICD-10-CM | POA: Diagnosis not present

## 2017-03-22 DIAGNOSIS — Z716 Tobacco abuse counseling: Secondary | ICD-10-CM | POA: Diagnosis not present

## 2017-03-22 DIAGNOSIS — Z008 Encounter for other general examination: Secondary | ICD-10-CM | POA: Diagnosis not present

## 2017-03-22 DIAGNOSIS — F17210 Nicotine dependence, cigarettes, uncomplicated: Secondary | ICD-10-CM | POA: Diagnosis not present

## 2017-03-22 DIAGNOSIS — I10 Essential (primary) hypertension: Secondary | ICD-10-CM | POA: Diagnosis not present

## 2017-03-22 DIAGNOSIS — Z719 Counseling, unspecified: Secondary | ICD-10-CM | POA: Diagnosis not present

## 2017-04-11 DIAGNOSIS — Z6827 Body mass index (BMI) 27.0-27.9, adult: Secondary | ICD-10-CM | POA: Diagnosis not present

## 2017-04-11 DIAGNOSIS — Z1389 Encounter for screening for other disorder: Secondary | ICD-10-CM | POA: Diagnosis not present

## 2017-04-11 DIAGNOSIS — M353 Polymyalgia rheumatica: Secondary | ICD-10-CM | POA: Diagnosis not present

## 2017-04-11 DIAGNOSIS — Z720 Tobacco use: Secondary | ICD-10-CM | POA: Diagnosis not present

## 2017-04-11 DIAGNOSIS — Z719 Counseling, unspecified: Secondary | ICD-10-CM | POA: Diagnosis not present

## 2017-04-19 DIAGNOSIS — Z716 Tobacco abuse counseling: Secondary | ICD-10-CM | POA: Diagnosis not present

## 2017-04-19 DIAGNOSIS — F17210 Nicotine dependence, cigarettes, uncomplicated: Secondary | ICD-10-CM | POA: Diagnosis not present

## 2017-05-22 DIAGNOSIS — F17210 Nicotine dependence, cigarettes, uncomplicated: Secondary | ICD-10-CM | POA: Diagnosis not present

## 2017-05-22 DIAGNOSIS — G479 Sleep disorder, unspecified: Secondary | ICD-10-CM | POA: Diagnosis not present

## 2017-05-22 DIAGNOSIS — Z716 Tobacco abuse counseling: Secondary | ICD-10-CM | POA: Diagnosis not present

## 2017-06-19 DIAGNOSIS — Z008 Encounter for other general examination: Secondary | ICD-10-CM | POA: Diagnosis not present

## 2017-06-19 DIAGNOSIS — F17210 Nicotine dependence, cigarettes, uncomplicated: Secondary | ICD-10-CM | POA: Diagnosis not present

## 2017-06-19 DIAGNOSIS — Z719 Counseling, unspecified: Secondary | ICD-10-CM | POA: Diagnosis not present

## 2017-06-19 DIAGNOSIS — I10 Essential (primary) hypertension: Secondary | ICD-10-CM | POA: Diagnosis not present

## 2017-06-19 DIAGNOSIS — E663 Overweight: Secondary | ICD-10-CM | POA: Diagnosis not present

## 2017-06-19 DIAGNOSIS — G479 Sleep disorder, unspecified: Secondary | ICD-10-CM | POA: Diagnosis not present

## 2017-06-19 DIAGNOSIS — Z716 Tobacco abuse counseling: Secondary | ICD-10-CM | POA: Diagnosis not present

## 2017-06-28 DIAGNOSIS — M353 Polymyalgia rheumatica: Secondary | ICD-10-CM | POA: Diagnosis not present

## 2017-06-28 DIAGNOSIS — F419 Anxiety disorder, unspecified: Secondary | ICD-10-CM | POA: Diagnosis not present

## 2017-06-28 DIAGNOSIS — Z1389 Encounter for screening for other disorder: Secondary | ICD-10-CM | POA: Diagnosis not present

## 2017-06-28 DIAGNOSIS — I872 Venous insufficiency (chronic) (peripheral): Secondary | ICD-10-CM | POA: Diagnosis not present

## 2017-06-28 DIAGNOSIS — Z6827 Body mass index (BMI) 27.0-27.9, adult: Secondary | ICD-10-CM | POA: Diagnosis not present

## 2017-07-26 DIAGNOSIS — F17210 Nicotine dependence, cigarettes, uncomplicated: Secondary | ICD-10-CM | POA: Diagnosis not present

## 2017-07-26 DIAGNOSIS — Z716 Tobacco abuse counseling: Secondary | ICD-10-CM | POA: Diagnosis not present

## 2017-09-06 DIAGNOSIS — F17210 Nicotine dependence, cigarettes, uncomplicated: Secondary | ICD-10-CM | POA: Diagnosis not present

## 2017-09-06 DIAGNOSIS — Z716 Tobacco abuse counseling: Secondary | ICD-10-CM | POA: Diagnosis not present

## 2017-10-04 DIAGNOSIS — R102 Pelvic and perineal pain: Secondary | ICD-10-CM | POA: Diagnosis not present

## 2017-10-04 DIAGNOSIS — Z30013 Encounter for initial prescription of injectable contraceptive: Secondary | ICD-10-CM | POA: Diagnosis not present

## 2017-10-04 DIAGNOSIS — Z01411 Encounter for gynecological examination (general) (routine) with abnormal findings: Secondary | ICD-10-CM | POA: Diagnosis not present

## 2017-10-04 DIAGNOSIS — Z6827 Body mass index (BMI) 27.0-27.9, adult: Secondary | ICD-10-CM | POA: Diagnosis not present

## 2017-10-05 DIAGNOSIS — Z3042 Encounter for surveillance of injectable contraceptive: Secondary | ICD-10-CM | POA: Diagnosis not present

## 2017-10-05 DIAGNOSIS — Z01411 Encounter for gynecological examination (general) (routine) with abnormal findings: Secondary | ICD-10-CM | POA: Diagnosis not present

## 2017-10-11 DIAGNOSIS — I10 Essential (primary) hypertension: Secondary | ICD-10-CM | POA: Diagnosis not present

## 2017-10-11 DIAGNOSIS — G894 Chronic pain syndrome: Secondary | ICD-10-CM | POA: Diagnosis not present

## 2017-10-11 DIAGNOSIS — M353 Polymyalgia rheumatica: Secondary | ICD-10-CM | POA: Diagnosis not present

## 2017-10-11 DIAGNOSIS — E663 Overweight: Secondary | ICD-10-CM | POA: Diagnosis not present

## 2017-10-11 DIAGNOSIS — Z1389 Encounter for screening for other disorder: Secondary | ICD-10-CM | POA: Diagnosis not present

## 2017-10-11 DIAGNOSIS — Z6827 Body mass index (BMI) 27.0-27.9, adult: Secondary | ICD-10-CM | POA: Diagnosis not present

## 2017-10-11 DIAGNOSIS — K219 Gastro-esophageal reflux disease without esophagitis: Secondary | ICD-10-CM | POA: Diagnosis not present

## 2017-10-11 DIAGNOSIS — F419 Anxiety disorder, unspecified: Secondary | ICD-10-CM | POA: Diagnosis not present

## 2017-10-25 DIAGNOSIS — R102 Pelvic and perineal pain: Secondary | ICD-10-CM | POA: Diagnosis not present

## 2017-10-25 DIAGNOSIS — Z6827 Body mass index (BMI) 27.0-27.9, adult: Secondary | ICD-10-CM | POA: Diagnosis not present

## 2017-11-01 DIAGNOSIS — Z716 Tobacco abuse counseling: Secondary | ICD-10-CM | POA: Diagnosis not present

## 2017-11-01 DIAGNOSIS — F17210 Nicotine dependence, cigarettes, uncomplicated: Secondary | ICD-10-CM | POA: Diagnosis not present

## 2017-12-27 DIAGNOSIS — Z3042 Encounter for surveillance of injectable contraceptive: Secondary | ICD-10-CM | POA: Diagnosis not present

## 2018-01-03 DIAGNOSIS — G894 Chronic pain syndrome: Secondary | ICD-10-CM | POA: Diagnosis not present

## 2018-01-03 DIAGNOSIS — I10 Essential (primary) hypertension: Secondary | ICD-10-CM | POA: Diagnosis not present

## 2018-01-03 DIAGNOSIS — K219 Gastro-esophageal reflux disease without esophagitis: Secondary | ICD-10-CM | POA: Diagnosis not present

## 2018-01-03 DIAGNOSIS — Z1389 Encounter for screening for other disorder: Secondary | ICD-10-CM | POA: Diagnosis not present

## 2018-01-03 DIAGNOSIS — Z6826 Body mass index (BMI) 26.0-26.9, adult: Secondary | ICD-10-CM | POA: Diagnosis not present

## 2018-01-03 DIAGNOSIS — F419 Anxiety disorder, unspecified: Secondary | ICD-10-CM | POA: Diagnosis not present

## 2018-01-03 DIAGNOSIS — E663 Overweight: Secondary | ICD-10-CM | POA: Diagnosis not present

## 2018-01-29 DIAGNOSIS — F17210 Nicotine dependence, cigarettes, uncomplicated: Secondary | ICD-10-CM | POA: Diagnosis not present

## 2018-01-29 DIAGNOSIS — Z716 Tobacco abuse counseling: Secondary | ICD-10-CM | POA: Diagnosis not present

## 2018-02-07 DIAGNOSIS — Z716 Tobacco abuse counseling: Secondary | ICD-10-CM | POA: Diagnosis not present

## 2018-02-07 DIAGNOSIS — F17210 Nicotine dependence, cigarettes, uncomplicated: Secondary | ICD-10-CM | POA: Diagnosis not present

## 2018-03-07 DIAGNOSIS — Z716 Tobacco abuse counseling: Secondary | ICD-10-CM | POA: Diagnosis not present

## 2018-03-07 DIAGNOSIS — F17210 Nicotine dependence, cigarettes, uncomplicated: Secondary | ICD-10-CM | POA: Diagnosis not present

## 2018-03-26 DIAGNOSIS — Z3042 Encounter for surveillance of injectable contraceptive: Secondary | ICD-10-CM | POA: Diagnosis not present

## 2018-04-02 DIAGNOSIS — E663 Overweight: Secondary | ICD-10-CM | POA: Diagnosis not present

## 2018-04-02 DIAGNOSIS — G894 Chronic pain syndrome: Secondary | ICD-10-CM | POA: Diagnosis not present

## 2018-04-02 DIAGNOSIS — K219 Gastro-esophageal reflux disease without esophagitis: Secondary | ICD-10-CM | POA: Diagnosis not present

## 2018-04-02 DIAGNOSIS — Z1389 Encounter for screening for other disorder: Secondary | ICD-10-CM | POA: Diagnosis not present

## 2018-04-02 DIAGNOSIS — R12 Heartburn: Secondary | ICD-10-CM | POA: Diagnosis not present

## 2018-04-02 DIAGNOSIS — Z719 Counseling, unspecified: Secondary | ICD-10-CM | POA: Diagnosis not present

## 2018-04-02 DIAGNOSIS — Z6826 Body mass index (BMI) 26.0-26.9, adult: Secondary | ICD-10-CM | POA: Diagnosis not present

## 2018-04-02 DIAGNOSIS — F419 Anxiety disorder, unspecified: Secondary | ICD-10-CM | POA: Diagnosis not present

## 2018-04-18 DIAGNOSIS — Z716 Tobacco abuse counseling: Secondary | ICD-10-CM | POA: Diagnosis not present

## 2018-04-18 DIAGNOSIS — Z008 Encounter for other general examination: Secondary | ICD-10-CM | POA: Diagnosis not present

## 2018-04-18 DIAGNOSIS — F17210 Nicotine dependence, cigarettes, uncomplicated: Secondary | ICD-10-CM | POA: Diagnosis not present

## 2018-04-18 DIAGNOSIS — Z719 Counseling, unspecified: Secondary | ICD-10-CM | POA: Diagnosis not present

## 2018-05-19 IMAGING — MR MR THORACIC SPINE W/O CM
4 of 6 series · 10 of 48 positions shown · non-contrast
Comparison: Chest radiograph September 22, 2015

CLINICAL DATA: Upper back pain for 7 months without injury.
Palpable mass RIGHT anterior lateral iliac crest.

EXAM:
MRI THORACIC SPINE WITHOUT CONTRAST
TECHNIQUE: Multiplanar, multisequence MR imaging of the thoracic spine was
performed. No intravenous contrast was administered.

[Series 4: T1 · sagittal · 4.0mm · 0.55mm/px · 3 of 13 slices shown (1 of 2)]
[im 3/13]
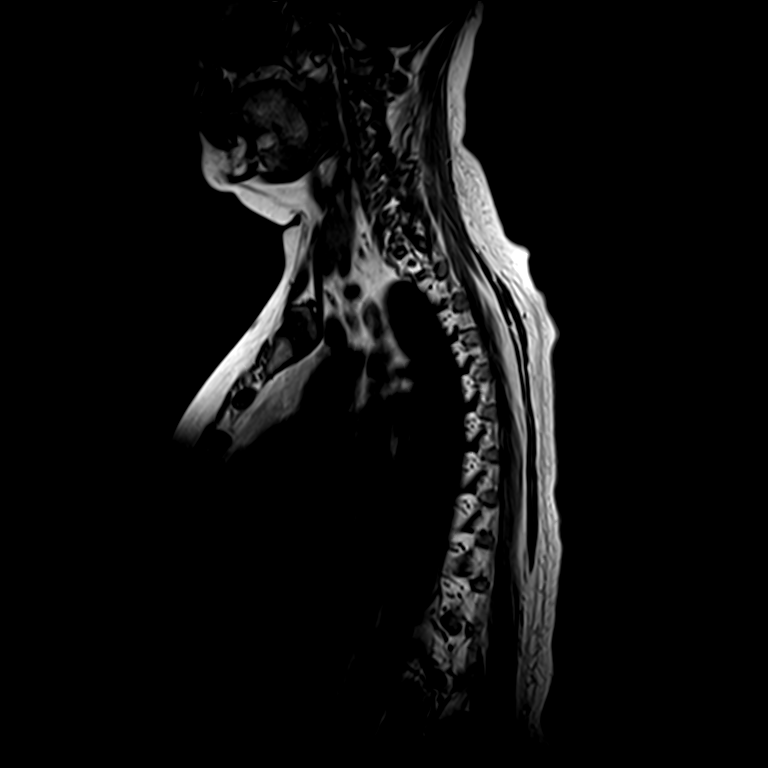
[im 8/13]
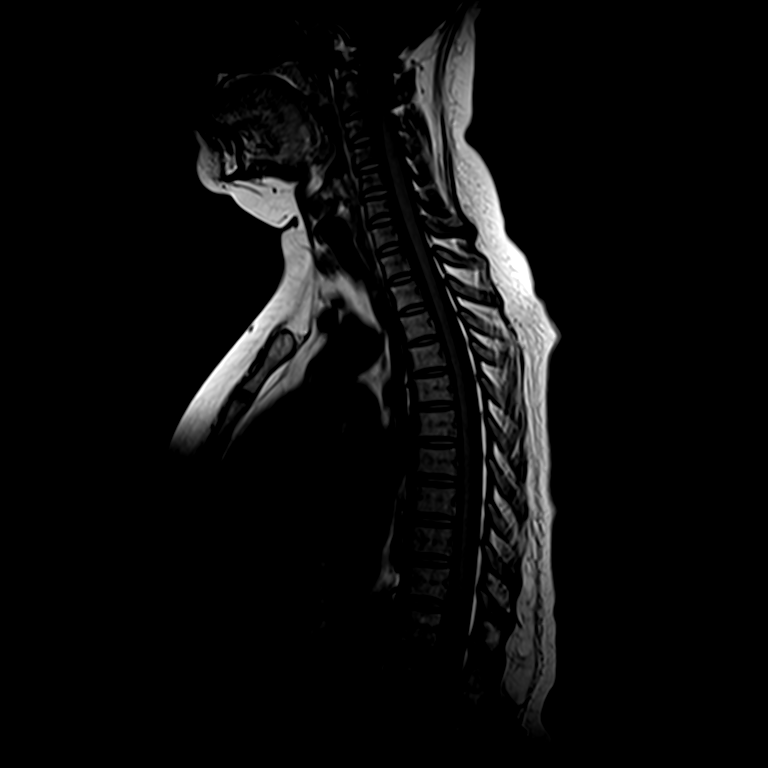
[im 13/13]
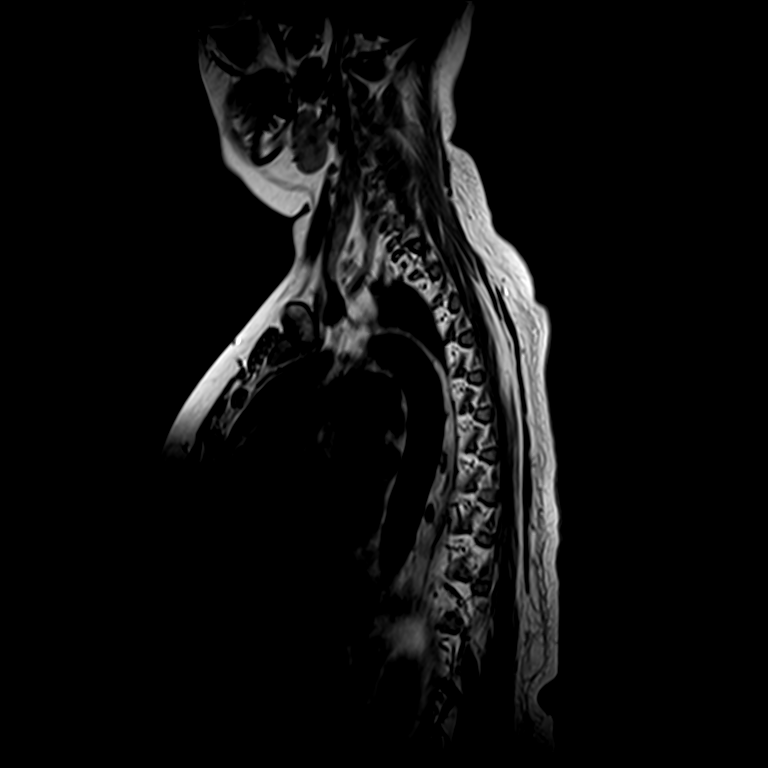

[Series 5: T2 · sagittal · 4.0mm · 0.40mm/px · 3 of 13 slices shown (1 of 2)]
[im 1/13]
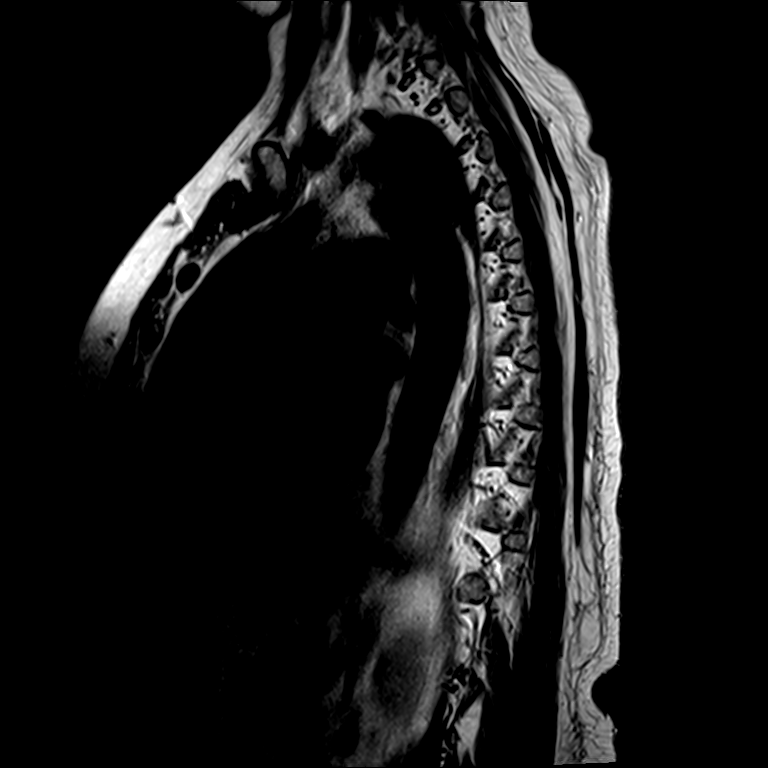
[im 7/13]
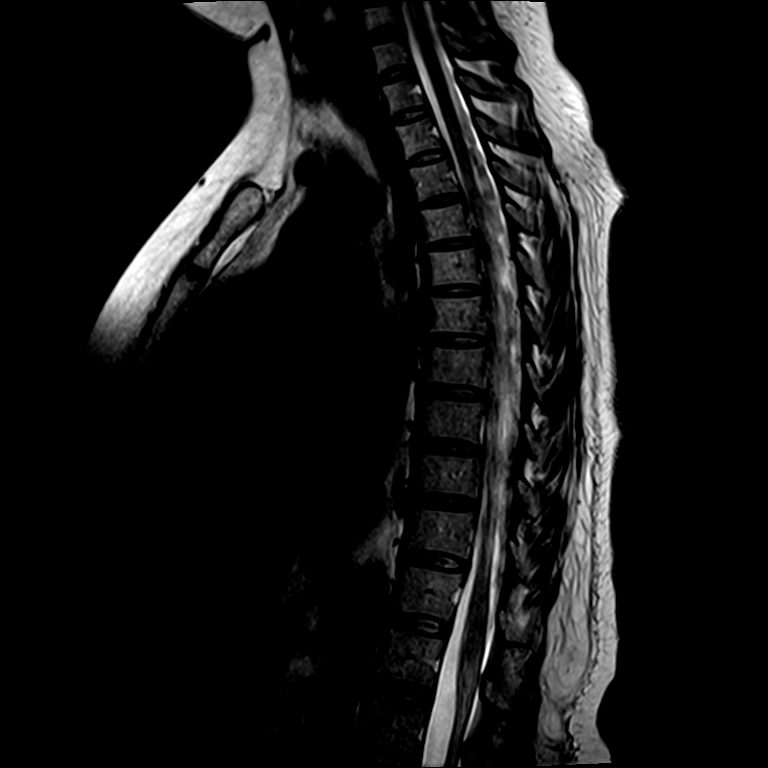
[im 13/13]
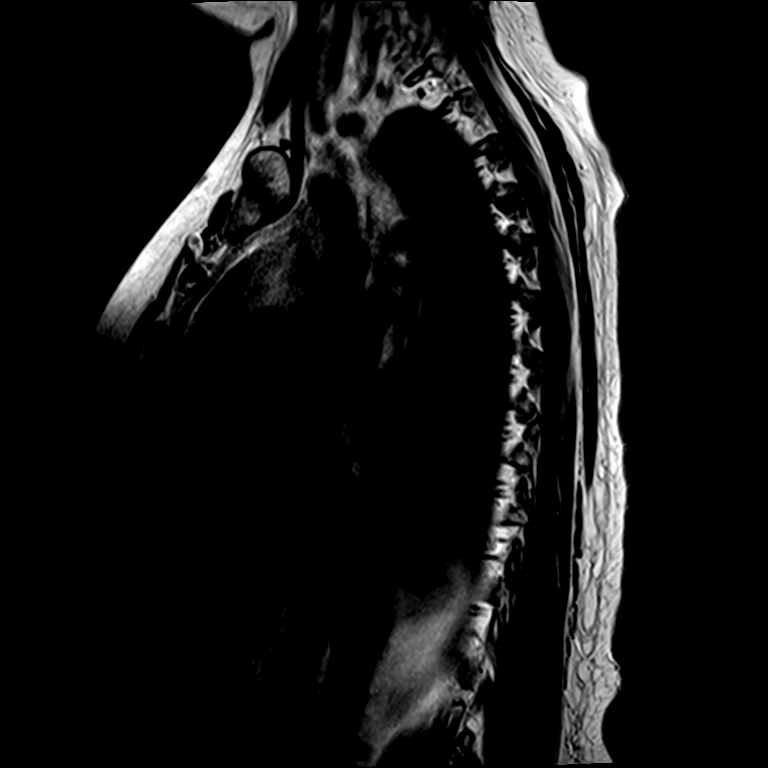

[Series 6: T1 · sagittal · 4.0mm · 0.40mm/px · 1 of 13 slices shown (2 of 2)]
[im 1/13]
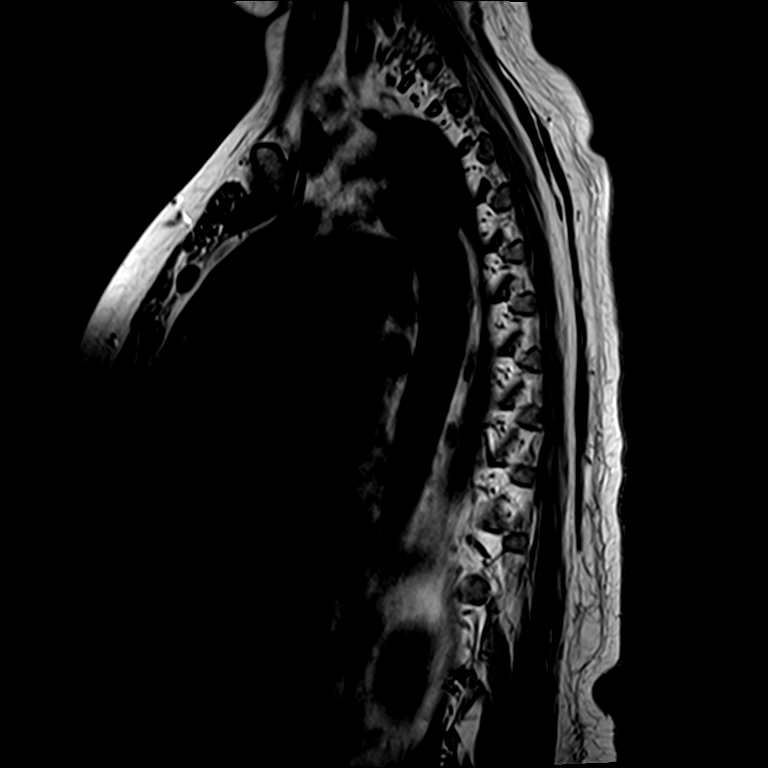

[Series 9: T2 · axial · 3.0mm · 0.18mm/px · z∈[-182,-48]mm · 3 of 33 slices shown (2 of 2)]
[im 6/33]
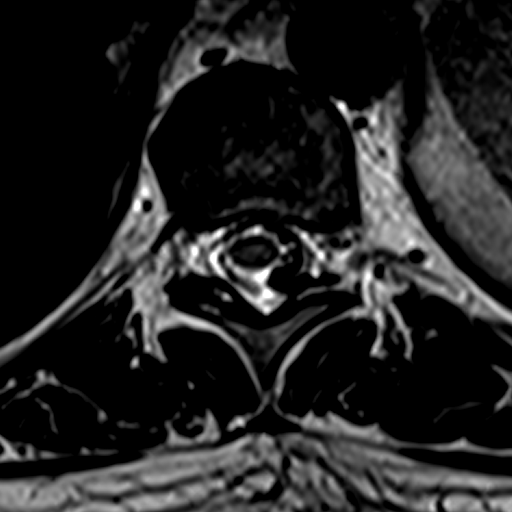
[im 17/33]
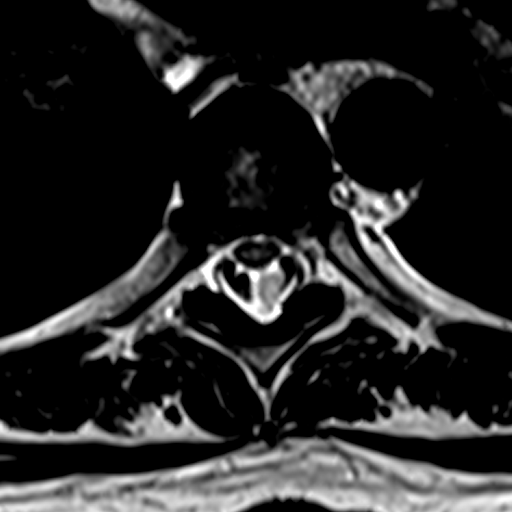
[im 27/33]
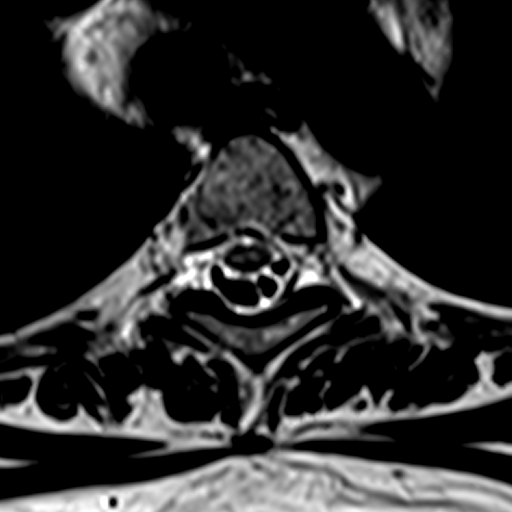

[10 of 48 positions shown; findings below may reference images not displayed]

FINDINGS: ALIGNMENT: Maintenance of the thoracic kyphosis. No malalignment.

VERTEBRAE/DISCS: Vertebral bodies are intact. Intervertebral discs
morphology and signal are normal.

CORD: Thoracic spinal cord is normal morphology and signal
characteristics to the level of the conus medullaris which
terminates at T12-L1.

PREVERTEBRAL AND PARASPINAL SOFT TISSUES:  Normal.

DISC LEVELS:

No disc bulge, canal stenosis or neural foraminal narrowing any
thoracic level.
IMPRESSION: Normal noncontrast thoracic spine MRI.

## 2018-06-04 DIAGNOSIS — Z6826 Body mass index (BMI) 26.0-26.9, adult: Secondary | ICD-10-CM | POA: Diagnosis not present

## 2018-06-04 DIAGNOSIS — L255 Unspecified contact dermatitis due to plants, except food: Secondary | ICD-10-CM | POA: Diagnosis not present

## 2018-06-04 DIAGNOSIS — Z1389 Encounter for screening for other disorder: Secondary | ICD-10-CM | POA: Diagnosis not present

## 2018-06-04 DIAGNOSIS — G894 Chronic pain syndrome: Secondary | ICD-10-CM | POA: Diagnosis not present

## 2018-06-04 DIAGNOSIS — F419 Anxiety disorder, unspecified: Secondary | ICD-10-CM | POA: Diagnosis not present

## 2018-06-04 DIAGNOSIS — Z13220 Encounter for screening for lipoid disorders: Secondary | ICD-10-CM | POA: Diagnosis not present

## 2018-06-04 DIAGNOSIS — Z0001 Encounter for general adult medical examination with abnormal findings: Secondary | ICD-10-CM | POA: Diagnosis not present

## 2018-06-04 DIAGNOSIS — Z0000 Encounter for general adult medical examination without abnormal findings: Secondary | ICD-10-CM | POA: Diagnosis not present

## 2018-06-04 DIAGNOSIS — E663 Overweight: Secondary | ICD-10-CM | POA: Diagnosis not present

## 2018-06-04 DIAGNOSIS — I10 Essential (primary) hypertension: Secondary | ICD-10-CM | POA: Diagnosis not present

## 2018-06-11 DIAGNOSIS — Z3042 Encounter for surveillance of injectable contraceptive: Secondary | ICD-10-CM | POA: Diagnosis not present

## 2018-07-11 DIAGNOSIS — I10 Essential (primary) hypertension: Secondary | ICD-10-CM | POA: Diagnosis not present

## 2018-07-11 DIAGNOSIS — E786 Lipoprotein deficiency: Secondary | ICD-10-CM | POA: Diagnosis not present

## 2018-07-11 DIAGNOSIS — Z008 Encounter for other general examination: Secondary | ICD-10-CM | POA: Diagnosis not present

## 2018-07-11 DIAGNOSIS — F17210 Nicotine dependence, cigarettes, uncomplicated: Secondary | ICD-10-CM | POA: Diagnosis not present

## 2018-07-11 DIAGNOSIS — Z719 Counseling, unspecified: Secondary | ICD-10-CM | POA: Diagnosis not present

## 2018-07-11 DIAGNOSIS — Z716 Tobacco abuse counseling: Secondary | ICD-10-CM | POA: Diagnosis not present

## 2018-08-28 DIAGNOSIS — L989 Disorder of the skin and subcutaneous tissue, unspecified: Secondary | ICD-10-CM | POA: Diagnosis not present

## 2018-08-28 DIAGNOSIS — Z6825 Body mass index (BMI) 25.0-25.9, adult: Secondary | ICD-10-CM | POA: Diagnosis not present

## 2018-08-28 DIAGNOSIS — Z1389 Encounter for screening for other disorder: Secondary | ICD-10-CM | POA: Diagnosis not present

## 2018-08-28 DIAGNOSIS — R509 Fever, unspecified: Secondary | ICD-10-CM | POA: Diagnosis not present

## 2018-08-31 DIAGNOSIS — Z3042 Encounter for surveillance of injectable contraceptive: Secondary | ICD-10-CM | POA: Diagnosis not present

## 2018-10-01 DIAGNOSIS — Z1389 Encounter for screening for other disorder: Secondary | ICD-10-CM | POA: Diagnosis not present

## 2018-10-01 DIAGNOSIS — G894 Chronic pain syndrome: Secondary | ICD-10-CM | POA: Diagnosis not present

## 2018-10-01 DIAGNOSIS — I10 Essential (primary) hypertension: Secondary | ICD-10-CM | POA: Diagnosis not present

## 2018-10-01 DIAGNOSIS — F419 Anxiety disorder, unspecified: Secondary | ICD-10-CM | POA: Diagnosis not present

## 2018-10-01 DIAGNOSIS — E663 Overweight: Secondary | ICD-10-CM | POA: Diagnosis not present

## 2018-10-01 DIAGNOSIS — Z6826 Body mass index (BMI) 26.0-26.9, adult: Secondary | ICD-10-CM | POA: Diagnosis not present

## 2018-10-01 DIAGNOSIS — K219 Gastro-esophageal reflux disease without esophagitis: Secondary | ICD-10-CM | POA: Diagnosis not present

## 2018-10-31 DIAGNOSIS — E782 Mixed hyperlipidemia: Secondary | ICD-10-CM | POA: Diagnosis not present

## 2018-10-31 DIAGNOSIS — Z716 Tobacco abuse counseling: Secondary | ICD-10-CM | POA: Diagnosis not present

## 2018-10-31 DIAGNOSIS — F17210 Nicotine dependence, cigarettes, uncomplicated: Secondary | ICD-10-CM | POA: Diagnosis not present

## 2018-11-29 DIAGNOSIS — Z3042 Encounter for surveillance of injectable contraceptive: Secondary | ICD-10-CM | POA: Diagnosis not present

## 2018-12-17 DIAGNOSIS — Z716 Tobacco abuse counseling: Secondary | ICD-10-CM | POA: Diagnosis not present

## 2018-12-17 DIAGNOSIS — Z008 Encounter for other general examination: Secondary | ICD-10-CM | POA: Diagnosis not present

## 2018-12-17 DIAGNOSIS — I10 Essential (primary) hypertension: Secondary | ICD-10-CM | POA: Diagnosis not present

## 2018-12-17 DIAGNOSIS — J0100 Acute maxillary sinusitis, unspecified: Secondary | ICD-10-CM | POA: Diagnosis not present

## 2018-12-17 DIAGNOSIS — E782 Mixed hyperlipidemia: Secondary | ICD-10-CM | POA: Diagnosis not present

## 2018-12-17 DIAGNOSIS — E786 Lipoprotein deficiency: Secondary | ICD-10-CM | POA: Diagnosis not present

## 2018-12-17 DIAGNOSIS — F17210 Nicotine dependence, cigarettes, uncomplicated: Secondary | ICD-10-CM | POA: Diagnosis not present

## 2018-12-26 DIAGNOSIS — F17210 Nicotine dependence, cigarettes, uncomplicated: Secondary | ICD-10-CM | POA: Diagnosis not present

## 2018-12-26 DIAGNOSIS — Z716 Tobacco abuse counseling: Secondary | ICD-10-CM | POA: Diagnosis not present

## 2018-12-28 DIAGNOSIS — J329 Chronic sinusitis, unspecified: Secondary | ICD-10-CM | POA: Diagnosis not present

## 2018-12-28 DIAGNOSIS — I10 Essential (primary) hypertension: Secondary | ICD-10-CM | POA: Diagnosis not present

## 2018-12-28 DIAGNOSIS — G894 Chronic pain syndrome: Secondary | ICD-10-CM | POA: Diagnosis not present

## 2018-12-28 DIAGNOSIS — K219 Gastro-esophageal reflux disease without esophagitis: Secondary | ICD-10-CM | POA: Diagnosis not present

## 2018-12-28 DIAGNOSIS — Z1389 Encounter for screening for other disorder: Secondary | ICD-10-CM | POA: Diagnosis not present

## 2018-12-28 DIAGNOSIS — Z6827 Body mass index (BMI) 27.0-27.9, adult: Secondary | ICD-10-CM | POA: Diagnosis not present

## 2019-01-14 DIAGNOSIS — Z716 Tobacco abuse counseling: Secondary | ICD-10-CM | POA: Diagnosis not present

## 2019-01-14 DIAGNOSIS — F17210 Nicotine dependence, cigarettes, uncomplicated: Secondary | ICD-10-CM | POA: Diagnosis not present

## 2019-01-14 DIAGNOSIS — H6981 Other specified disorders of Eustachian tube, right ear: Secondary | ICD-10-CM | POA: Diagnosis not present

## 2019-02-05 DIAGNOSIS — M353 Polymyalgia rheumatica: Secondary | ICD-10-CM | POA: Diagnosis not present

## 2019-02-05 DIAGNOSIS — E663 Overweight: Secondary | ICD-10-CM | POA: Diagnosis not present

## 2019-02-05 DIAGNOSIS — Z1389 Encounter for screening for other disorder: Secondary | ICD-10-CM | POA: Diagnosis not present

## 2019-02-05 DIAGNOSIS — G894 Chronic pain syndrome: Secondary | ICD-10-CM | POA: Diagnosis not present

## 2019-02-05 DIAGNOSIS — Z6828 Body mass index (BMI) 28.0-28.9, adult: Secondary | ICD-10-CM | POA: Diagnosis not present

## 2019-02-26 DIAGNOSIS — Z3042 Encounter for surveillance of injectable contraceptive: Secondary | ICD-10-CM | POA: Diagnosis not present

## 2019-03-06 DIAGNOSIS — Z6828 Body mass index (BMI) 28.0-28.9, adult: Secondary | ICD-10-CM | POA: Diagnosis not present

## 2019-03-06 DIAGNOSIS — G894 Chronic pain syndrome: Secondary | ICD-10-CM | POA: Diagnosis not present

## 2019-03-06 DIAGNOSIS — Z1389 Encounter for screening for other disorder: Secondary | ICD-10-CM | POA: Diagnosis not present

## 2019-03-06 DIAGNOSIS — M353 Polymyalgia rheumatica: Secondary | ICD-10-CM | POA: Diagnosis not present

## 2019-03-06 DIAGNOSIS — I10 Essential (primary) hypertension: Secondary | ICD-10-CM | POA: Diagnosis not present

## 2019-03-06 DIAGNOSIS — E663 Overweight: Secondary | ICD-10-CM | POA: Diagnosis not present

## 2019-04-09 DIAGNOSIS — Z6828 Body mass index (BMI) 28.0-28.9, adult: Secondary | ICD-10-CM | POA: Diagnosis not present

## 2019-04-09 DIAGNOSIS — G894 Chronic pain syndrome: Secondary | ICD-10-CM | POA: Diagnosis not present

## 2019-04-09 DIAGNOSIS — Z1389 Encounter for screening for other disorder: Secondary | ICD-10-CM | POA: Diagnosis not present

## 2019-04-09 DIAGNOSIS — I10 Essential (primary) hypertension: Secondary | ICD-10-CM | POA: Diagnosis not present

## 2019-04-09 DIAGNOSIS — E663 Overweight: Secondary | ICD-10-CM | POA: Diagnosis not present

## 2019-05-21 DIAGNOSIS — Z3042 Encounter for surveillance of injectable contraceptive: Secondary | ICD-10-CM | POA: Diagnosis not present

## 2019-06-11 DIAGNOSIS — E663 Overweight: Secondary | ICD-10-CM | POA: Diagnosis not present

## 2019-06-11 DIAGNOSIS — M353 Polymyalgia rheumatica: Secondary | ICD-10-CM | POA: Diagnosis not present

## 2019-06-11 DIAGNOSIS — Z1389 Encounter for screening for other disorder: Secondary | ICD-10-CM | POA: Diagnosis not present

## 2019-06-11 DIAGNOSIS — I10 Essential (primary) hypertension: Secondary | ICD-10-CM | POA: Diagnosis not present

## 2019-06-11 DIAGNOSIS — Z6829 Body mass index (BMI) 29.0-29.9, adult: Secondary | ICD-10-CM | POA: Diagnosis not present

## 2019-06-11 DIAGNOSIS — G894 Chronic pain syndrome: Secondary | ICD-10-CM | POA: Diagnosis not present

## 2019-07-10 DIAGNOSIS — G894 Chronic pain syndrome: Secondary | ICD-10-CM | POA: Diagnosis not present

## 2019-08-02 ENCOUNTER — Other Ambulatory Visit: Payer: Self-pay | Admitting: *Deleted

## 2019-08-02 DIAGNOSIS — R6889 Other general symptoms and signs: Secondary | ICD-10-CM | POA: Diagnosis not present

## 2019-08-02 DIAGNOSIS — Z20822 Contact with and (suspected) exposure to covid-19: Secondary | ICD-10-CM

## 2019-08-04 LAB — NOVEL CORONAVIRUS, NAA: SARS-CoV-2, NAA: NOT DETECTED

## 2019-08-13 DIAGNOSIS — G894 Chronic pain syndrome: Secondary | ICD-10-CM | POA: Diagnosis not present

## 2019-08-22 DIAGNOSIS — Z3042 Encounter for surveillance of injectable contraceptive: Secondary | ICD-10-CM | POA: Diagnosis not present

## 2019-08-22 DIAGNOSIS — Z3202 Encounter for pregnancy test, result negative: Secondary | ICD-10-CM | POA: Diagnosis not present

## 2019-09-02 DIAGNOSIS — Z3042 Encounter for surveillance of injectable contraceptive: Secondary | ICD-10-CM | POA: Diagnosis not present

## 2019-09-02 DIAGNOSIS — Z01419 Encounter for gynecological examination (general) (routine) without abnormal findings: Secondary | ICD-10-CM | POA: Diagnosis not present

## 2019-09-02 DIAGNOSIS — Z720 Tobacco use: Secondary | ICD-10-CM | POA: Diagnosis not present

## 2019-09-02 DIAGNOSIS — Z113 Encounter for screening for infections with a predominantly sexual mode of transmission: Secondary | ICD-10-CM | POA: Diagnosis not present

## 2019-09-11 DIAGNOSIS — G894 Chronic pain syndrome: Secondary | ICD-10-CM | POA: Diagnosis not present

## 2019-09-11 DIAGNOSIS — I10 Essential (primary) hypertension: Secondary | ICD-10-CM | POA: Diagnosis not present

## 2019-09-11 DIAGNOSIS — Z6830 Body mass index (BMI) 30.0-30.9, adult: Secondary | ICD-10-CM | POA: Diagnosis not present

## 2019-09-11 DIAGNOSIS — M353 Polymyalgia rheumatica: Secondary | ICD-10-CM | POA: Diagnosis not present

## 2019-09-23 DIAGNOSIS — R87612 Low grade squamous intraepithelial lesion on cytologic smear of cervix (LGSIL): Secondary | ICD-10-CM | POA: Diagnosis not present

## 2019-10-16 DIAGNOSIS — G894 Chronic pain syndrome: Secondary | ICD-10-CM | POA: Diagnosis not present

## 2019-10-16 DIAGNOSIS — M353 Polymyalgia rheumatica: Secondary | ICD-10-CM | POA: Diagnosis not present

## 2019-11-13 DIAGNOSIS — G894 Chronic pain syndrome: Secondary | ICD-10-CM | POA: Diagnosis not present

## 2019-11-19 DIAGNOSIS — Z3042 Encounter for surveillance of injectable contraceptive: Secondary | ICD-10-CM | POA: Diagnosis not present

## 2019-12-16 DIAGNOSIS — E669 Obesity, unspecified: Secondary | ICD-10-CM | POA: Diagnosis not present

## 2019-12-16 DIAGNOSIS — G894 Chronic pain syndrome: Secondary | ICD-10-CM | POA: Diagnosis not present

## 2019-12-16 DIAGNOSIS — I10 Essential (primary) hypertension: Secondary | ICD-10-CM | POA: Diagnosis not present

## 2020-01-15 DIAGNOSIS — K219 Gastro-esophageal reflux disease without esophagitis: Secondary | ICD-10-CM | POA: Diagnosis not present

## 2020-01-15 DIAGNOSIS — G894 Chronic pain syndrome: Secondary | ICD-10-CM | POA: Diagnosis not present

## 2020-02-18 DIAGNOSIS — G894 Chronic pain syndrome: Secondary | ICD-10-CM | POA: Diagnosis not present

## 2020-02-20 DIAGNOSIS — Z3042 Encounter for surveillance of injectable contraceptive: Secondary | ICD-10-CM | POA: Diagnosis not present

## 2020-02-20 DIAGNOSIS — Z3202 Encounter for pregnancy test, result negative: Secondary | ICD-10-CM | POA: Diagnosis not present

## 2020-03-18 DIAGNOSIS — G894 Chronic pain syndrome: Secondary | ICD-10-CM | POA: Diagnosis not present

## 2020-03-25 DIAGNOSIS — R87612 Low grade squamous intraepithelial lesion on cytologic smear of cervix (LGSIL): Secondary | ICD-10-CM | POA: Diagnosis not present

## 2020-03-25 DIAGNOSIS — Z6829 Body mass index (BMI) 29.0-29.9, adult: Secondary | ICD-10-CM | POA: Diagnosis not present

## 2020-04-20 DIAGNOSIS — G894 Chronic pain syndrome: Secondary | ICD-10-CM | POA: Diagnosis not present

## 2020-05-13 DIAGNOSIS — Z3042 Encounter for surveillance of injectable contraceptive: Secondary | ICD-10-CM | POA: Diagnosis not present

## 2020-07-20 DIAGNOSIS — F17290 Nicotine dependence, other tobacco product, uncomplicated: Secondary | ICD-10-CM | POA: Diagnosis not present

## 2020-07-20 DIAGNOSIS — E6609 Other obesity due to excess calories: Secondary | ICD-10-CM | POA: Diagnosis not present

## 2020-07-20 DIAGNOSIS — Z1389 Encounter for screening for other disorder: Secondary | ICD-10-CM | POA: Diagnosis not present

## 2020-07-20 DIAGNOSIS — Z6830 Body mass index (BMI) 30.0-30.9, adult: Secondary | ICD-10-CM | POA: Diagnosis not present

## 2020-07-20 DIAGNOSIS — Z719 Counseling, unspecified: Secondary | ICD-10-CM | POA: Diagnosis not present

## 2020-07-20 DIAGNOSIS — G894 Chronic pain syndrome: Secondary | ICD-10-CM | POA: Diagnosis not present

## 2020-07-20 DIAGNOSIS — I10 Essential (primary) hypertension: Secondary | ICD-10-CM | POA: Diagnosis not present

## 2020-07-20 DIAGNOSIS — K219 Gastro-esophageal reflux disease without esophagitis: Secondary | ICD-10-CM | POA: Diagnosis not present

## 2020-08-05 DIAGNOSIS — Z3042 Encounter for surveillance of injectable contraceptive: Secondary | ICD-10-CM | POA: Diagnosis not present

## 2020-08-24 DIAGNOSIS — I10 Essential (primary) hypertension: Secondary | ICD-10-CM | POA: Diagnosis not present

## 2020-08-24 DIAGNOSIS — G894 Chronic pain syndrome: Secondary | ICD-10-CM | POA: Diagnosis not present

## 2020-08-24 DIAGNOSIS — M353 Polymyalgia rheumatica: Secondary | ICD-10-CM | POA: Diagnosis not present

## 2020-08-24 DIAGNOSIS — K219 Gastro-esophageal reflux disease without esophagitis: Secondary | ICD-10-CM | POA: Diagnosis not present

## 2020-09-25 DIAGNOSIS — G894 Chronic pain syndrome: Secondary | ICD-10-CM | POA: Diagnosis not present

## 2020-09-25 DIAGNOSIS — I10 Essential (primary) hypertension: Secondary | ICD-10-CM | POA: Diagnosis not present

## 2020-10-28 DIAGNOSIS — G894 Chronic pain syndrome: Secondary | ICD-10-CM | POA: Diagnosis not present

## 2020-10-28 DIAGNOSIS — I10 Essential (primary) hypertension: Secondary | ICD-10-CM | POA: Diagnosis not present

## 2020-10-28 DIAGNOSIS — Z6830 Body mass index (BMI) 30.0-30.9, adult: Secondary | ICD-10-CM | POA: Diagnosis not present

## 2020-10-28 DIAGNOSIS — F419 Anxiety disorder, unspecified: Secondary | ICD-10-CM | POA: Diagnosis not present

## 2020-10-28 DIAGNOSIS — E6609 Other obesity due to excess calories: Secondary | ICD-10-CM | POA: Diagnosis not present

## 2020-10-29 DIAGNOSIS — Z3042 Encounter for surveillance of injectable contraceptive: Secondary | ICD-10-CM | POA: Diagnosis not present

## 2020-11-25 DIAGNOSIS — G894 Chronic pain syndrome: Secondary | ICD-10-CM | POA: Diagnosis not present

## 2020-11-25 DIAGNOSIS — I10 Essential (primary) hypertension: Secondary | ICD-10-CM | POA: Diagnosis not present

## 2020-12-29 DIAGNOSIS — Z6831 Body mass index (BMI) 31.0-31.9, adult: Secondary | ICD-10-CM | POA: Diagnosis not present

## 2020-12-29 DIAGNOSIS — K219 Gastro-esophageal reflux disease without esophagitis: Secondary | ICD-10-CM | POA: Diagnosis not present

## 2020-12-29 DIAGNOSIS — G894 Chronic pain syndrome: Secondary | ICD-10-CM | POA: Diagnosis not present

## 2020-12-29 DIAGNOSIS — I10 Essential (primary) hypertension: Secondary | ICD-10-CM | POA: Diagnosis not present

## 2021-01-20 DIAGNOSIS — Z3042 Encounter for surveillance of injectable contraceptive: Secondary | ICD-10-CM | POA: Diagnosis not present

## 2021-01-25 DIAGNOSIS — G894 Chronic pain syndrome: Secondary | ICD-10-CM | POA: Diagnosis not present

## 2021-01-25 DIAGNOSIS — I10 Essential (primary) hypertension: Secondary | ICD-10-CM | POA: Diagnosis not present

## 2021-01-26 DIAGNOSIS — Z3042 Encounter for surveillance of injectable contraceptive: Secondary | ICD-10-CM | POA: Diagnosis not present

## 2021-01-26 DIAGNOSIS — Z720 Tobacco use: Secondary | ICD-10-CM | POA: Diagnosis not present

## 2021-01-26 DIAGNOSIS — Z113 Encounter for screening for infections with a predominantly sexual mode of transmission: Secondary | ICD-10-CM | POA: Diagnosis not present

## 2021-01-26 DIAGNOSIS — Z01419 Encounter for gynecological examination (general) (routine) without abnormal findings: Secondary | ICD-10-CM | POA: Diagnosis not present

## 2021-02-25 DIAGNOSIS — M353 Polymyalgia rheumatica: Secondary | ICD-10-CM | POA: Diagnosis not present

## 2021-02-25 DIAGNOSIS — G894 Chronic pain syndrome: Secondary | ICD-10-CM | POA: Diagnosis not present

## 2021-02-26 DIAGNOSIS — N870 Mild cervical dysplasia: Secondary | ICD-10-CM | POA: Diagnosis not present

## 2021-02-26 DIAGNOSIS — R87612 Low grade squamous intraepithelial lesion on cytologic smear of cervix (LGSIL): Secondary | ICD-10-CM | POA: Diagnosis not present

## 2021-03-31 DIAGNOSIS — G894 Chronic pain syndrome: Secondary | ICD-10-CM | POA: Diagnosis not present

## 2021-03-31 DIAGNOSIS — R0789 Other chest pain: Secondary | ICD-10-CM | POA: Diagnosis not present

## 2021-03-31 DIAGNOSIS — Z13220 Encounter for screening for lipoid disorders: Secondary | ICD-10-CM | POA: Diagnosis not present

## 2021-03-31 DIAGNOSIS — Z79891 Long term (current) use of opiate analgesic: Secondary | ICD-10-CM | POA: Diagnosis not present

## 2021-03-31 DIAGNOSIS — R079 Chest pain, unspecified: Secondary | ICD-10-CM | POA: Diagnosis not present

## 2021-03-31 DIAGNOSIS — Z6830 Body mass index (BMI) 30.0-30.9, adult: Secondary | ICD-10-CM | POA: Diagnosis not present

## 2021-03-31 DIAGNOSIS — I10 Essential (primary) hypertension: Secondary | ICD-10-CM | POA: Diagnosis not present

## 2021-04-09 DIAGNOSIS — Z3042 Encounter for surveillance of injectable contraceptive: Secondary | ICD-10-CM | POA: Diagnosis not present

## 2021-04-28 DIAGNOSIS — G894 Chronic pain syndrome: Secondary | ICD-10-CM | POA: Diagnosis not present

## 2021-04-28 DIAGNOSIS — M353 Polymyalgia rheumatica: Secondary | ICD-10-CM | POA: Diagnosis not present

## 2021-05-27 DIAGNOSIS — I10 Essential (primary) hypertension: Secondary | ICD-10-CM | POA: Diagnosis not present

## 2021-05-27 DIAGNOSIS — G894 Chronic pain syndrome: Secondary | ICD-10-CM | POA: Diagnosis not present

## 2021-07-07 DIAGNOSIS — Z3042 Encounter for surveillance of injectable contraceptive: Secondary | ICD-10-CM | POA: Diagnosis not present

## 2021-08-10 DIAGNOSIS — G894 Chronic pain syndrome: Secondary | ICD-10-CM | POA: Diagnosis not present

## 2021-09-16 DIAGNOSIS — G894 Chronic pain syndrome: Secondary | ICD-10-CM | POA: Diagnosis not present

## 2021-09-16 DIAGNOSIS — M353 Polymyalgia rheumatica: Secondary | ICD-10-CM | POA: Diagnosis not present

## 2021-09-16 DIAGNOSIS — Z6831 Body mass index (BMI) 31.0-31.9, adult: Secondary | ICD-10-CM | POA: Diagnosis not present

## 2021-09-16 DIAGNOSIS — E6609 Other obesity due to excess calories: Secondary | ICD-10-CM | POA: Diagnosis not present

## 2021-09-28 DIAGNOSIS — Z3042 Encounter for surveillance of injectable contraceptive: Secondary | ICD-10-CM | POA: Diagnosis not present

## 2021-10-18 DIAGNOSIS — G894 Chronic pain syndrome: Secondary | ICD-10-CM | POA: Diagnosis not present

## 2021-10-18 DIAGNOSIS — I10 Essential (primary) hypertension: Secondary | ICD-10-CM | POA: Diagnosis not present

## 2021-11-17 DIAGNOSIS — G894 Chronic pain syndrome: Secondary | ICD-10-CM | POA: Diagnosis not present

## 2021-11-17 DIAGNOSIS — E6609 Other obesity due to excess calories: Secondary | ICD-10-CM | POA: Diagnosis not present

## 2021-11-17 DIAGNOSIS — Z6830 Body mass index (BMI) 30.0-30.9, adult: Secondary | ICD-10-CM | POA: Diagnosis not present

## 2021-11-17 DIAGNOSIS — I10 Essential (primary) hypertension: Secondary | ICD-10-CM | POA: Diagnosis not present

## 2021-12-22 DIAGNOSIS — I10 Essential (primary) hypertension: Secondary | ICD-10-CM | POA: Diagnosis not present

## 2021-12-22 DIAGNOSIS — G894 Chronic pain syndrome: Secondary | ICD-10-CM | POA: Diagnosis not present

## 2021-12-23 DIAGNOSIS — Z3042 Encounter for surveillance of injectable contraceptive: Secondary | ICD-10-CM | POA: Diagnosis not present

## 2022-02-21 DIAGNOSIS — G894 Chronic pain syndrome: Secondary | ICD-10-CM | POA: Diagnosis not present

## 2022-02-21 DIAGNOSIS — I10 Essential (primary) hypertension: Secondary | ICD-10-CM | POA: Diagnosis not present

## 2022-03-14 DIAGNOSIS — Z3042 Encounter for surveillance of injectable contraceptive: Secondary | ICD-10-CM | POA: Diagnosis not present

## 2022-04-05 ENCOUNTER — Other Ambulatory Visit: Payer: Self-pay | Admitting: Internal Medicine

## 2022-04-05 ENCOUNTER — Other Ambulatory Visit (HOSPITAL_COMMUNITY): Payer: Self-pay | Admitting: Internal Medicine

## 2022-04-05 DIAGNOSIS — M5416 Radiculopathy, lumbar region: Secondary | ICD-10-CM | POA: Diagnosis not present

## 2022-04-05 DIAGNOSIS — E049 Nontoxic goiter, unspecified: Secondary | ICD-10-CM | POA: Diagnosis not present

## 2022-04-05 DIAGNOSIS — G894 Chronic pain syndrome: Secondary | ICD-10-CM | POA: Diagnosis not present

## 2022-04-05 DIAGNOSIS — I10 Essential (primary) hypertension: Secondary | ICD-10-CM | POA: Diagnosis not present

## 2022-04-05 DIAGNOSIS — Z6830 Body mass index (BMI) 30.0-30.9, adult: Secondary | ICD-10-CM | POA: Diagnosis not present

## 2022-04-05 DIAGNOSIS — E6609 Other obesity due to excess calories: Secondary | ICD-10-CM | POA: Diagnosis not present

## 2022-04-13 DIAGNOSIS — Z720 Tobacco use: Secondary | ICD-10-CM | POA: Diagnosis not present

## 2022-04-13 DIAGNOSIS — Z01419 Encounter for gynecological examination (general) (routine) without abnormal findings: Secondary | ICD-10-CM | POA: Diagnosis not present

## 2022-04-13 DIAGNOSIS — Z113 Encounter for screening for infections with a predominantly sexual mode of transmission: Secondary | ICD-10-CM | POA: Diagnosis not present

## 2022-04-13 DIAGNOSIS — R102 Pelvic and perineal pain: Secondary | ICD-10-CM | POA: Diagnosis not present

## 2022-04-22 DIAGNOSIS — N838 Other noninflammatory disorders of ovary, fallopian tube and broad ligament: Secondary | ICD-10-CM | POA: Diagnosis not present

## 2022-04-22 DIAGNOSIS — R1031 Right lower quadrant pain: Secondary | ICD-10-CM | POA: Diagnosis not present

## 2022-04-22 DIAGNOSIS — R102 Pelvic and perineal pain: Secondary | ICD-10-CM | POA: Diagnosis not present

## 2022-05-03 DIAGNOSIS — G894 Chronic pain syndrome: Secondary | ICD-10-CM | POA: Diagnosis not present

## 2022-05-03 DIAGNOSIS — M5416 Radiculopathy, lumbar region: Secondary | ICD-10-CM | POA: Diagnosis not present

## 2022-05-03 DIAGNOSIS — I10 Essential (primary) hypertension: Secondary | ICD-10-CM | POA: Diagnosis not present

## 2022-05-30 DIAGNOSIS — F419 Anxiety disorder, unspecified: Secondary | ICD-10-CM | POA: Diagnosis not present

## 2022-05-30 DIAGNOSIS — I10 Essential (primary) hypertension: Secondary | ICD-10-CM | POA: Diagnosis not present

## 2022-05-30 DIAGNOSIS — E6609 Other obesity due to excess calories: Secondary | ICD-10-CM | POA: Diagnosis not present

## 2022-05-30 DIAGNOSIS — K219 Gastro-esophageal reflux disease without esophagitis: Secondary | ICD-10-CM | POA: Diagnosis not present

## 2022-05-30 DIAGNOSIS — Z6830 Body mass index (BMI) 30.0-30.9, adult: Secondary | ICD-10-CM | POA: Diagnosis not present

## 2022-05-30 DIAGNOSIS — M5416 Radiculopathy, lumbar region: Secondary | ICD-10-CM | POA: Diagnosis not present

## 2022-05-30 DIAGNOSIS — G8929 Other chronic pain: Secondary | ICD-10-CM | POA: Diagnosis not present

## 2022-06-30 DIAGNOSIS — M545 Low back pain, unspecified: Secondary | ICD-10-CM | POA: Diagnosis not present

## 2022-06-30 DIAGNOSIS — M7061 Trochanteric bursitis, right hip: Secondary | ICD-10-CM | POA: Diagnosis not present

## 2022-07-07 DIAGNOSIS — Z3042 Encounter for surveillance of injectable contraceptive: Secondary | ICD-10-CM | POA: Diagnosis not present

## 2022-07-26 DIAGNOSIS — M5416 Radiculopathy, lumbar region: Secondary | ICD-10-CM | POA: Diagnosis not present

## 2022-07-26 DIAGNOSIS — G8929 Other chronic pain: Secondary | ICD-10-CM | POA: Diagnosis not present

## 2022-07-26 DIAGNOSIS — F419 Anxiety disorder, unspecified: Secondary | ICD-10-CM | POA: Diagnosis not present

## 2022-07-26 DIAGNOSIS — K219 Gastro-esophageal reflux disease without esophagitis: Secondary | ICD-10-CM | POA: Diagnosis not present

## 2022-07-28 DIAGNOSIS — Z1231 Encounter for screening mammogram for malignant neoplasm of breast: Secondary | ICD-10-CM | POA: Diagnosis not present

## 2022-09-20 ENCOUNTER — Other Ambulatory Visit (HOSPITAL_COMMUNITY): Payer: Self-pay | Admitting: Internal Medicine

## 2022-09-20 DIAGNOSIS — M5416 Radiculopathy, lumbar region: Secondary | ICD-10-CM | POA: Diagnosis not present

## 2022-09-20 DIAGNOSIS — I10 Essential (primary) hypertension: Secondary | ICD-10-CM | POA: Diagnosis not present

## 2022-09-20 DIAGNOSIS — Z136 Encounter for screening for cardiovascular disorders: Secondary | ICD-10-CM | POA: Diagnosis not present

## 2022-09-20 DIAGNOSIS — F419 Anxiety disorder, unspecified: Secondary | ICD-10-CM | POA: Diagnosis not present

## 2022-09-20 DIAGNOSIS — I739 Peripheral vascular disease, unspecified: Secondary | ICD-10-CM | POA: Diagnosis not present

## 2022-09-20 DIAGNOSIS — Z6831 Body mass index (BMI) 31.0-31.9, adult: Secondary | ICD-10-CM | POA: Diagnosis not present

## 2022-09-20 DIAGNOSIS — G894 Chronic pain syndrome: Secondary | ICD-10-CM | POA: Diagnosis not present

## 2022-09-20 DIAGNOSIS — E6609 Other obesity due to excess calories: Secondary | ICD-10-CM | POA: Diagnosis not present

## 2022-09-28 ENCOUNTER — Ambulatory Visit (HOSPITAL_COMMUNITY)
Admission: RE | Admit: 2022-09-28 | Discharge: 2022-09-28 | Disposition: A | Discharge status: Home / Self Care | Payer: BC Managed Care – PPO | Source: Ambulatory Visit | Attending: Internal Medicine | Admitting: Internal Medicine

## 2022-09-28 ENCOUNTER — Other Ambulatory Visit (HOSPITAL_COMMUNITY): Payer: BC Managed Care – PPO

## 2022-09-28 DIAGNOSIS — I700 Atherosclerosis of aorta: Secondary | ICD-10-CM | POA: Diagnosis not present

## 2022-09-28 DIAGNOSIS — Z136 Encounter for screening for cardiovascular disorders: Secondary | ICD-10-CM | POA: Diagnosis not present

## 2022-09-28 DIAGNOSIS — I6523 Occlusion and stenosis of bilateral carotid arteries: Secondary | ICD-10-CM | POA: Diagnosis not present

## 2022-09-28 DIAGNOSIS — I739 Peripheral vascular disease, unspecified: Secondary | ICD-10-CM | POA: Diagnosis not present

## 2022-09-28 DIAGNOSIS — I70211 Atherosclerosis of native arteries of extremities with intermittent claudication, right leg: Secondary | ICD-10-CM | POA: Diagnosis not present

## 2022-10-06 ENCOUNTER — Other Ambulatory Visit: Payer: Self-pay | Admitting: *Deleted

## 2022-10-06 DIAGNOSIS — M79606 Pain in leg, unspecified: Secondary | ICD-10-CM

## 2022-10-06 DIAGNOSIS — Z3042 Encounter for surveillance of injectable contraceptive: Secondary | ICD-10-CM | POA: Diagnosis not present

## 2022-10-10 ENCOUNTER — Ambulatory Visit: Payer: BC Managed Care – PPO | Admitting: Surgery

## 2022-10-10 ENCOUNTER — Ambulatory Visit (HOSPITAL_COMMUNITY)
Admission: RE | Admit: 2022-10-10 | Discharge: 2022-10-10 | Disposition: A | Discharge status: Home / Self Care | Payer: BC Managed Care – PPO | Source: Ambulatory Visit | Attending: Surgery | Admitting: Surgery

## 2022-10-10 ENCOUNTER — Encounter: Payer: Self-pay | Admitting: Surgery

## 2022-10-10 VITALS — BP 133/86 | HR 89 | Temp 98.2°F | Resp 20 | Ht 63.0 in | Wt 187.0 lb

## 2022-10-10 DIAGNOSIS — M79606 Pain in leg, unspecified: Secondary | ICD-10-CM | POA: Insufficient documentation

## 2022-10-10 DIAGNOSIS — I70211 Atherosclerosis of native arteries of extremities with intermittent claudication, right leg: Secondary | ICD-10-CM | POA: Diagnosis not present

## 2022-10-10 MED ORDER — ROSUVASTATIN CALCIUM 10 MG PO TABS: 10.0000 mg | ORAL_TABLET | Freq: Every day | ORAL | 12 refills | Status: DC

## 2022-10-10 MED ORDER — CILOSTAZOL 100 MG PO TABS: 100.0000 mg | ORAL_TABLET | Freq: Two times a day (BID) | ORAL | 11 refills | Status: DC

## 2022-10-10 NOTE — Progress Notes (Signed)
[facility]  Patient name: [name] MRN: [id number] DOB: September 06, 1981 Sex: female   REQUESTING PROVIDER:    Dr. Sherwood Gambler   REASON FOR CONSULT:    Claudication  HISTORY OF PRESENT ILLNESS:   [name] is a 41 y.o. female, who is referred for evaluation of right leg pain.  The patient states that it began about 5 months ago.  She was initially treated for a pinched nerve with steroids with no relief.  She was then referred to orthopedics who diagnosed her with bursitis and treated her without benefit.  She describes her pain as occurring at about 100 feet of walking.  She gets a cramping type pain from her hip all the way down to her foot which is alleviated with rest.  This has a significant effect on her quality of life as she does a lot of walking at work.  Patient is a current smoker.  She is medically managed for hypertension.  She is taking a baby aspirin.  PAST MEDICAL HISTORY    Past Medical History:  Diagnosis Date   Acid reflux    Hypertension      FAMILY HISTORY   History reviewed. No pertinent family history.  SOCIAL HISTORY:   Social History   Socioeconomic History   Marital status: Married    Spouse name: Not on file   Number of children: Not on file   Years of education: Not on file   Highest education level: Not on file  Occupational History   Not on file  Tobacco Use   Smoking status: Every Day    Packs/day: 1.00    Types: Cigarettes   Smokeless tobacco: Not on file  Substance and Sexual Activity   Alcohol use: No   Drug use: No   Sexual activity: Yes    Birth control/protection: Condom  Other Topics Concern   Not on file  Social History Narrative   Not on file   Social Determinants of Health   Financial Resource Strain: Not on file  Food Insecurity: Not on file  Transportation Needs: Not on file  Physical Activity: Not on file  Stress: Not on file  Social  Connections: Not on file  Intimate Partner Violence: Not on file    ALLERGIES:    Allergies  Allergen Reactions   Amoxicillin Hives    CURRENT MEDICATIONS:    Current Outpatient Medications  Medication Sig Dispense Refill   cilostazol (PLETAL) 100 MG tablet Take 1 tablet (100 mg total) by mouth 2 (two) times daily before a meal. 60 tablet 11   omeprazole (PRILOSEC) 20 MG capsule Take 20 mg by mouth 2 (two) times daily before a meal.     rosuvastatin (CRESTOR) 10 MG tablet Take 1 tablet (10 mg total) by mouth daily. 30 tablet 12   No current facility-administered medications for this visit.    REVIEW OF SYSTEMS:   [X]  denotes positive finding, [ ]  denotes negative finding Cardiac  Comments:  Chest pain or chest pressure:    Shortness of breath upon exertion:    Short of breath when lying flat:    Irregular heart rhythm:        Vascular    Pain in calf, thigh, or hip brought on by ambulation: x   Pain in feet at night that wakes you up from your sleep:     Blood clot in your veins:    Leg swelling:  Pulmonary    Oxygen at home:    Productive cough:     Wheezing:         Neurologic    Sudden weakness in arms or legs:     Sudden numbness in arms or legs:     Sudden onset of difficulty speaking or slurred speech:    Temporary loss of vision in one eye:     Problems with dizziness:         Gastrointestinal    Blood in stool:      Vomited blood:         Genitourinary    Burning when urinating:     Blood in urine:        Psychiatric    Major depression:         Hematologic    Bleeding problems:    Problems with blood clotting too easily:        Skin    Rashes or ulcers:        Constitutional    Fever or chills:     PHYSICAL EXAM:   Vitals:   10/10/22 0821  BP: 133/86  Pulse: 89  Resp: 20  Temp: 98.2 F (36.8 C)  SpO2: 97%  Weight: 187 lb (84.8 kg)  Height: 5\' 3"  (1.6 m)    GENERAL: The patient is a well-nourished female, in no  acute distress. The vital signs are documented above. CARDIAC: There is a regular rate and rhythm.  VASCULAR: Palpable left femoral and left dorsalis pedis pulse.  Nonpalpable right femoral or right pedal pulses PULMONARY: Nonlabored respirations ABDOMEN: Soft and non-tender MUSCULOSKELETAL: There are no major deformities or cyanosis. NEUROLOGIC: No focal weakness or paresthesias are detected. SKIN: There are no ulcers or rashes noted. PSYCHIATRIC: The patient has a normal affect.  STUDIES:   I have reviewed the following: This shows monophasic waveforms on the right with ABI 0.8 and biphasic waveforms on the left with an ABI of 1  ASSESSMENT and PLAN   Right leg claudication: Based off of her noninvasive imaging and her symptoms, I feel like this is all related to arterial insufficiency.  On exam she does not have a palpable femoral or pedal pulse.  I discussed that our initial approach would be trying to manage this medically.  I stressed the importance of smoking cessation.  I started her on Crestor 10 mg.  I encouraged her to begin a walking program.  She will be given a trial of cilostazol.  I will have her follow-up in 1 month to assess how she is doing.  If she has not gotten significant relief, the neck step would be angiography.   , MD, FACS [facility] [phone] Pager [phone]

## 2022-11-07 ENCOUNTER — Ambulatory Visit: Payer: BC Managed Care – PPO | Admitting: Surgery

## 2022-11-07 ENCOUNTER — Encounter: Payer: Self-pay | Admitting: Surgery

## 2022-11-07 VITALS — BP 126/89 | HR 112 | Temp 97.8°F | Resp 20 | Ht 63.0 in | Wt 191.0 lb

## 2022-11-07 DIAGNOSIS — I70211 Atherosclerosis of native arteries of extremities with intermittent claudication, right leg: Secondary | ICD-10-CM

## 2022-11-07 MED ORDER — METOPROLOL TARTRATE 25 MG PO TABS: 25.0000 mg | ORAL_TABLET | Freq: Every day | ORAL | 6 refills | Status: DC

## 2022-11-07 NOTE — Progress Notes (Signed)
[facility] [place]  Patient name: [name] MRN: [id number] DOB: 02-20-1981 Sex: female   REASON FOR VISIT:    Follow up  HISOTRY OF PRESENT ILLNESS:    [name] is a 41 y.o. female, who is referred for evaluation of right leg pain.  The patient states that it began about 5 months ago.  She was initially treated for a pinched nerve with steroids with no relief.  She was then referred to orthopedics who diagnosed her with bursitis and treated her without benefit.  She describes her pain as occurring at about 100 feet of walking.  She gets a cramping type pain from her hip all the way down to her foot which is alleviated with rest.  This has a significant effect on her quality of life as she does a lot of walking at work.  She did not have a palpable femoral or pedal pulse.  I initially elected to treat her medically with an exercise program.  I started her on Crestor as well as Pletal.  She states that she has had a good response to Pletal and her pain is much better, however she has been having an elevated heart rate   Patient is a current smoker.  She is medically managed for hypertension.  She is taking a baby aspirin.   PAST MEDICAL HISTORY:   Past Medical History:  Diagnosis Date   Acid reflux    Hypertension      FAMILY HISTORY:   History reviewed. No pertinent family history.  SOCIAL HISTORY:   Social History   Tobacco Use   Smoking status: Every Day    Packs/day: 1.00    Types: Cigarettes   Smokeless tobacco: Not on file  Substance Use Topics   Alcohol use: No     ALLERGIES:   Allergies  Allergen Reactions   Amoxicillin Hives     CURRENT MEDICATIONS:   Current Outpatient Medications  Medication Sig Dispense Refill   ALPRAZolam (XANAX) 0.5 MG tablet Take 1 tablet by mouth 4 (four) times daily. Using prn     cilostazol (PLETAL) 100 MG tablet Take 1 tablet (100 mg total) by mouth 2  (two) times daily before a meal. 60 tablet 11   HYDROcodone-acetaminophen (NORCO) 10-325 MG tablet Take 1 tablet by mouth every 4 (four) hours as needed.     medroxyPROGESTERone (DEPO-PROVERA) 150 MG/ML injection Inject 150 mg into the muscle every 3 (three) months.     omeprazole (PRILOSEC) 20 MG capsule Take 20 mg by mouth 2 (two) times daily before a meal.     rosuvastatin (CRESTOR) 10 MG tablet Take 1 tablet (10 mg total) by mouth daily. 30 tablet 12   No current facility-administered medications for this visit.    REVIEW OF SYSTEMS:   [X]  denotes positive finding, [ ]  denotes negative finding Cardiac  Comments:  Chest pain or chest pressure:    Shortness of breath upon exertion:    Short of breath when lying flat:    Irregular heart rhythm:        Vascular    Pain in calf, thigh, or hip brought on by ambulation: x   Pain in feet at night that wakes you up from your sleep:     Blood clot in your veins:    Leg swelling:         Pulmonary    Oxygen at home:    Productive cough:     Wheezing:  Neurologic    Sudden weakness in arms or legs:     Sudden numbness in arms or legs:     Sudden onset of difficulty speaking or slurred speech:    Temporary loss of vision in one eye:     Problems with dizziness:         Gastrointestinal    Blood in stool:     Vomited blood:         Genitourinary    Burning when urinating:     Blood in urine:        Psychiatric    Major depression:         Hematologic    Bleeding problems:    Problems with blood clotting too easily:        Skin    Rashes or ulcers:        Constitutional    Fever or chills:      PHYSICAL EXAM:   Vitals:   11/07/22 1509  BP: 126/89  Pulse: (!) 112  Resp: 20  Temp: 97.8 F (36.6 C)  SpO2: 97%  Weight: 191 lb (86.6 kg)  Height: 5\' 3"  (1.6 m)    GENERAL: The patient is a well-nourished female, in no acute distress. The vital signs are documented above. CARDIAC: There is a regular rate  and rhythm.  VASCULAR: Nonpalpable right femoral or pedal pulse PULMONARY: Non-labored respirations MUSCULOSKELETAL: There are no major deformities or cyanosis. NEUROLOGIC: No focal weakness or paresthesias are detected. SKIN: There are no ulcers or rashes noted. PSYCHIATRIC: The patient has a normal affect.  STUDIES:   none  MEDICAL ISSUES:   Right leg claudication: The patient got a good response with cilostazol, however she had a tachycardic side effect.  She does not want to pursue surgical options at this time and wants to continue the medication.  Her heart rate is 112 today.  I therefore have started her on metoprolol to try to get her heart rate lower.  I have also told her that she needs to reach out to her primary care physician for follow-up in the next week or 2.  I am also going to order a CT angiogram of the abdomen pelvis with bilateral runoff to better define her occlusion to see what options we may have for endovascular repair.  That being said because of her age and limited durability of endovascular or open interventions, I would like to delay intervention for as long as possible.    , MD, FACS [facility] [phone] Pager [phone]

## 2022-11-17 ENCOUNTER — Other Ambulatory Visit: Payer: Self-pay

## 2022-11-17 DIAGNOSIS — I70211 Atherosclerosis of native arteries of extremities with intermittent claudication, right leg: Secondary | ICD-10-CM

## 2022-11-17 DIAGNOSIS — M79606 Pain in leg, unspecified: Secondary | ICD-10-CM

## 2022-12-08 DIAGNOSIS — T50905A Adverse effect of unspecified drugs, medicaments and biological substances, initial encounter: Secondary | ICD-10-CM | POA: Diagnosis not present

## 2022-12-08 DIAGNOSIS — I10 Essential (primary) hypertension: Secondary | ICD-10-CM | POA: Diagnosis not present

## 2022-12-08 DIAGNOSIS — E6609 Other obesity due to excess calories: Secondary | ICD-10-CM | POA: Diagnosis not present

## 2022-12-08 DIAGNOSIS — G8929 Other chronic pain: Secondary | ICD-10-CM | POA: Diagnosis not present

## 2022-12-08 DIAGNOSIS — M353 Polymyalgia rheumatica: Secondary | ICD-10-CM | POA: Diagnosis not present

## 2022-12-08 DIAGNOSIS — I739 Peripheral vascular disease, unspecified: Secondary | ICD-10-CM | POA: Diagnosis not present

## 2022-12-08 DIAGNOSIS — Z6831 Body mass index (BMI) 31.0-31.9, adult: Secondary | ICD-10-CM | POA: Diagnosis not present

## 2022-12-12 ENCOUNTER — Ambulatory Visit: Payer: BC Managed Care – PPO | Admitting: Surgery

## 2022-12-30 ENCOUNTER — Ambulatory Visit (HOSPITAL_COMMUNITY)
Admission: RE | Admit: 2022-12-30 | Discharge: 2022-12-30 | Disposition: A | Discharge status: Home / Self Care | Payer: BC Managed Care – PPO | Source: Ambulatory Visit | Attending: Surgery | Admitting: Surgery

## 2022-12-30 DIAGNOSIS — I701 Atherosclerosis of renal artery: Secondary | ICD-10-CM | POA: Diagnosis not present

## 2022-12-30 DIAGNOSIS — I739 Peripheral vascular disease, unspecified: Secondary | ICD-10-CM | POA: Diagnosis not present

## 2022-12-30 DIAGNOSIS — K449 Diaphragmatic hernia without obstruction or gangrene: Secondary | ICD-10-CM | POA: Diagnosis not present

## 2022-12-30 DIAGNOSIS — I70211 Atherosclerosis of native arteries of extremities with intermittent claudication, right leg: Secondary | ICD-10-CM | POA: Insufficient documentation

## 2022-12-30 DIAGNOSIS — M79606 Pain in leg, unspecified: Secondary | ICD-10-CM | POA: Diagnosis not present

## 2022-12-30 DIAGNOSIS — Z3042 Encounter for surveillance of injectable contraceptive: Secondary | ICD-10-CM | POA: Diagnosis not present

## 2022-12-30 DIAGNOSIS — I745 Embolism and thrombosis of iliac artery: Secondary | ICD-10-CM | POA: Diagnosis not present

## 2022-12-30 MED ORDER — IOHEXOL 350 MG/ML SOLN
125.0000 mL | Freq: Once | INTRAVENOUS | Status: AC | PRN
  Administered 2022-12-30: 125 mL via INTRAVENOUS

## 2023-01-09 ENCOUNTER — Encounter: Payer: Self-pay | Admitting: Surgery

## 2023-01-09 ENCOUNTER — Ambulatory Visit: Payer: BC Managed Care – PPO | Admitting: Surgery

## 2023-01-09 VITALS — BP 130/87 | HR 97 | Temp 98.0°F | Resp 14 | Ht 63.0 in | Wt 186.0 lb

## 2023-01-09 DIAGNOSIS — I70211 Atherosclerosis of native arteries of extremities with intermittent claudication, right leg: Secondary | ICD-10-CM

## 2023-01-09 NOTE — Progress Notes (Signed)
[facility] [place]  Patient name: [name] MRN: [id number] DOB: [birth year] Sex: female   REASON FOR VISIT:    Follow up  HISOTRY OF PRESENT ILLNESS:    [name] is a 42 y.o. female who I saw in December 2023 for evaluation of right leg pain which had been going on for approximately 5 months.  She was initially treated for a pinched nerve with steroids which did not give her any relief.  She then saw orthopedics who treated her for bursitis without benefit.  She states that she develops pain and cramping from her hip all the way down to her foot at about 100 feet of walking.  Her symptoms are alleviated with rest.  This has been having a significant effect on her quality of life as she does a lot of walking at work.  She was initially treated medically with a walking program and Pletal, however she had tachycardia with the medication.  Her heart rate in the office was 112 and so I started her on metoprolol to see if this improves her symptoms.  I like to order a CT angiogram because I had trouble feeling a right femoral pulse.  She is back today for follow-up.   I had placed her on metoprolol for her tachycardia.  This was changed by her PCP.  She seems to be doing well with it.  PAST MEDICAL HISTORY:   Past Medical History:  Diagnosis Date   Acid reflux    Hypertension      FAMILY HISTORY:   No family history on file.  SOCIAL HISTORY:   Social History   Tobacco Use   Smoking status: Every Day    Packs/day: 1.00    Types: Cigarettes   Smokeless tobacco: Not on file  Substance Use Topics   Alcohol use: No     ALLERGIES:   Allergies  Allergen Reactions   Amoxicillin Hives     CURRENT MEDICATIONS:   Current Outpatient Medications  Medication Sig Dispense Refill   ALPRAZolam (XANAX) 0.5 MG tablet Take 1 tablet by mouth 4 (four) times daily. Using prn     cilostazol (PLETAL) 100 MG tablet  Take 1 tablet (100 mg total) by mouth 2 (two) times daily before a meal. 60 tablet 11   HYDROcodone-acetaminophen (NORCO) 10-325 MG tablet Take 1 tablet by mouth every 4 (four) hours as needed.     medroxyPROGESTERone (DEPO-PROVERA) 150 MG/ML injection Inject 150 mg into the muscle every 3 (three) months.     metoprolol tartrate (LOPRESSOR) 25 MG tablet Take 1 tablet (25 mg total) by mouth daily. 30 tablet 6   omeprazole (PRILOSEC) 20 MG capsule Take 20 mg by mouth 2 (two) times daily before a meal.     rosuvastatin (CRESTOR) 10 MG tablet Take 1 tablet (10 mg total) by mouth daily. 30 tablet 12   No current facility-administered medications for this visit.    REVIEW OF SYSTEMS:   [X]$  denotes positive finding, [ ]$  denotes negative finding Cardiac  Comments:  Chest pain or chest pressure:    Shortness of breath upon exertion:    Short of breath when lying flat:    Irregular heart rhythm:        Vascular    Pain in calf, thigh, or hip brought on by ambulation:    Pain in feet at night that wakes you up from your sleep:     Blood clot in your veins:  Leg swelling:         Pulmonary    Oxygen at home:    Productive cough:     Wheezing:         Neurologic    Sudden weakness in arms or legs:     Sudden numbness in arms or legs:     Sudden onset of difficulty speaking or slurred speech:    Temporary loss of vision in one eye:     Problems with dizziness:         Gastrointestinal    Blood in stool:     Vomited blood:         Genitourinary    Burning when urinating:     Blood in urine:        Psychiatric    Major depression:         Hematologic    Bleeding problems:    Problems with blood clotting too easily:        Skin    Rashes or ulcers:        Constitutional    Fever or chills:      PHYSICAL EXAM:   There were no vitals filed for this visit.  GENERAL: The patient is a well-nourished female, in no acute distress. The vital signs are documented  above. CARDIAC: There is a regular rate and rhythm.  PULMONARY: Non-labored respirations MUSCULOSKELETAL: There are no major deformities or cyanosis. NEUROLOGIC: No focal weakness or paresthesias are detected. SKIN: There are no ulcers or rashes noted. PSYCHIATRIC: The patient has a normal affect.  STUDIES:   I have reviewed her CT scan with the following findings: VASCULAR   1. Focal high-grade stenosis of the proximal right common iliac artery secondary to a mix of fibrofatty and calcified atherosclerotic plaque. No evidence of outflow or runoff disease. 2. Mild heterogeneous atherosclerotic plaque in the distal abdominal aorta without evidence of aneurysm or dissection. Aortic Atherosclerosis (ICD10-I70.0). 3. 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   NON-VASCULAR   1. Large hiatal hernia. 2. Surgical changes of prior cholecystectomy. 3. No acute abnormality within the abdomen or pelvis. MEDICAL ISSUES:   Right iliac artery occlusion: I think this explains all of the patient's symptoms that she is having.  She has had symptomatic improvement with cilostazol.  She continues to smoke.  I discussed with her that I would like to delay intervention for as long as possible given her young age as well as the small size of her arteries.  I think that her iliac stenosis can be treated with stenting, however I would not recommend that at this time since she can tolerate her disabilities.  I did discuss that if she develops a nonhealing wound that all things change and we would want to fix this to avoid any kind of tissue loss.  She is scheduled to follow-up with me in 1 year with repeat ABIs.  If her symptoms change she will contact me sooner.    Leia Alf, MD, FACS [facility] [phone] Pager [phone]

## 2023-01-18 DIAGNOSIS — G894 Chronic pain syndrome: Secondary | ICD-10-CM | POA: Diagnosis not present

## 2023-01-18 DIAGNOSIS — I10 Essential (primary) hypertension: Secondary | ICD-10-CM | POA: Diagnosis not present

## 2023-01-18 DIAGNOSIS — M5416 Radiculopathy, lumbar region: Secondary | ICD-10-CM | POA: Diagnosis not present

## 2023-01-18 DIAGNOSIS — M353 Polymyalgia rheumatica: Secondary | ICD-10-CM | POA: Diagnosis not present

## 2023-02-16 DIAGNOSIS — M353 Polymyalgia rheumatica: Secondary | ICD-10-CM | POA: Diagnosis not present

## 2023-02-16 DIAGNOSIS — I10 Essential (primary) hypertension: Secondary | ICD-10-CM | POA: Diagnosis not present

## 2023-02-16 DIAGNOSIS — G894 Chronic pain syndrome: Secondary | ICD-10-CM | POA: Diagnosis not present

## 2023-02-16 DIAGNOSIS — K219 Gastro-esophageal reflux disease without esophagitis: Secondary | ICD-10-CM | POA: Diagnosis not present

## 2023-03-29 DIAGNOSIS — Z3042 Encounter for surveillance of injectable contraceptive: Secondary | ICD-10-CM | POA: Diagnosis not present

## 2023-04-26 DIAGNOSIS — E6609 Other obesity due to excess calories: Secondary | ICD-10-CM | POA: Diagnosis not present

## 2023-04-26 DIAGNOSIS — E538 Deficiency of other specified B group vitamins: Secondary | ICD-10-CM | POA: Diagnosis not present

## 2023-04-26 DIAGNOSIS — R5383 Other fatigue: Secondary | ICD-10-CM | POA: Diagnosis not present

## 2023-04-26 DIAGNOSIS — F419 Anxiety disorder, unspecified: Secondary | ICD-10-CM | POA: Diagnosis not present

## 2023-04-26 DIAGNOSIS — K219 Gastro-esophageal reflux disease without esophagitis: Secondary | ICD-10-CM | POA: Diagnosis not present

## 2023-04-26 DIAGNOSIS — M353 Polymyalgia rheumatica: Secondary | ICD-10-CM | POA: Diagnosis not present

## 2023-04-26 DIAGNOSIS — G894 Chronic pain syndrome: Secondary | ICD-10-CM | POA: Diagnosis not present

## 2023-04-26 DIAGNOSIS — R400 Somnolence: Secondary | ICD-10-CM | POA: Diagnosis not present

## 2023-04-26 DIAGNOSIS — Z6830 Body mass index (BMI) 30.0-30.9, adult: Secondary | ICD-10-CM | POA: Diagnosis not present

## 2023-04-26 DIAGNOSIS — M5416 Radiculopathy, lumbar region: Secondary | ICD-10-CM | POA: Diagnosis not present

## 2023-04-26 DIAGNOSIS — E559 Vitamin D deficiency, unspecified: Secondary | ICD-10-CM | POA: Diagnosis not present

## 2023-04-26 DIAGNOSIS — Z9229 Personal history of other drug therapy: Secondary | ICD-10-CM | POA: Diagnosis not present

## 2023-04-26 DIAGNOSIS — I10 Essential (primary) hypertension: Secondary | ICD-10-CM | POA: Diagnosis not present

## 2023-04-26 DIAGNOSIS — Z0001 Encounter for general adult medical examination with abnormal findings: Secondary | ICD-10-CM | POA: Diagnosis not present

## 2023-04-27 DIAGNOSIS — D518 Other vitamin B12 deficiency anemias: Secondary | ICD-10-CM | POA: Diagnosis not present

## 2023-05-11 DIAGNOSIS — Z01419 Encounter for gynecological examination (general) (routine) without abnormal findings: Secondary | ICD-10-CM | POA: Diagnosis not present

## 2023-05-11 DIAGNOSIS — Z113 Encounter for screening for infections with a predominantly sexual mode of transmission: Secondary | ICD-10-CM | POA: Diagnosis not present

## 2023-05-11 DIAGNOSIS — Z8742 Personal history of other diseases of the female genital tract: Secondary | ICD-10-CM | POA: Diagnosis not present

## 2023-05-11 DIAGNOSIS — Z720 Tobacco use: Secondary | ICD-10-CM | POA: Diagnosis not present

## 2023-05-11 DIAGNOSIS — Z3042 Encounter for surveillance of injectable contraceptive: Secondary | ICD-10-CM | POA: Diagnosis not present

## 2023-05-24 DIAGNOSIS — I10 Essential (primary) hypertension: Secondary | ICD-10-CM | POA: Diagnosis not present

## 2023-05-24 DIAGNOSIS — M5416 Radiculopathy, lumbar region: Secondary | ICD-10-CM | POA: Diagnosis not present

## 2023-05-24 DIAGNOSIS — G894 Chronic pain syndrome: Secondary | ICD-10-CM | POA: Diagnosis not present

## 2023-05-24 DIAGNOSIS — J0100 Acute maxillary sinusitis, unspecified: Secondary | ICD-10-CM | POA: Diagnosis not present

## 2023-06-08 DIAGNOSIS — D518 Other vitamin B12 deficiency anemias: Secondary | ICD-10-CM | POA: Diagnosis not present

## 2023-06-22 DIAGNOSIS — I10 Essential (primary) hypertension: Secondary | ICD-10-CM | POA: Diagnosis not present

## 2023-06-22 DIAGNOSIS — M5416 Radiculopathy, lumbar region: Secondary | ICD-10-CM | POA: Diagnosis not present

## 2023-06-22 DIAGNOSIS — Z6830 Body mass index (BMI) 30.0-30.9, adult: Secondary | ICD-10-CM | POA: Diagnosis not present

## 2023-06-22 DIAGNOSIS — G894 Chronic pain syndrome: Secondary | ICD-10-CM | POA: Diagnosis not present

## 2023-06-22 DIAGNOSIS — E6609 Other obesity due to excess calories: Secondary | ICD-10-CM | POA: Diagnosis not present

## 2023-07-05 DIAGNOSIS — Z3202 Encounter for pregnancy test, result negative: Secondary | ICD-10-CM | POA: Diagnosis not present

## 2023-07-05 DIAGNOSIS — Z3042 Encounter for surveillance of injectable contraceptive: Secondary | ICD-10-CM | POA: Diagnosis not present

## 2023-07-24 DIAGNOSIS — G894 Chronic pain syndrome: Secondary | ICD-10-CM | POA: Diagnosis not present

## 2023-07-24 DIAGNOSIS — I10 Essential (primary) hypertension: Secondary | ICD-10-CM | POA: Diagnosis not present

## 2023-07-24 DIAGNOSIS — M5416 Radiculopathy, lumbar region: Secondary | ICD-10-CM | POA: Diagnosis not present

## 2023-09-13 DIAGNOSIS — M5416 Radiculopathy, lumbar region: Secondary | ICD-10-CM | POA: Diagnosis not present

## 2023-09-13 DIAGNOSIS — T50905A Adverse effect of unspecified drugs, medicaments and biological substances, initial encounter: Secondary | ICD-10-CM | POA: Diagnosis not present

## 2023-09-13 DIAGNOSIS — I10 Essential (primary) hypertension: Secondary | ICD-10-CM | POA: Diagnosis not present

## 2023-09-13 DIAGNOSIS — D518 Other vitamin B12 deficiency anemias: Secondary | ICD-10-CM | POA: Diagnosis not present

## 2023-09-13 DIAGNOSIS — G894 Chronic pain syndrome: Secondary | ICD-10-CM | POA: Diagnosis not present

## 2023-09-22 DIAGNOSIS — Z3042 Encounter for surveillance of injectable contraceptive: Secondary | ICD-10-CM | POA: Diagnosis not present

## 2023-10-12 ENCOUNTER — Other Ambulatory Visit: Payer: Self-pay | Admitting: Surgery

## 2023-11-09 DIAGNOSIS — R5383 Other fatigue: Secondary | ICD-10-CM | POA: Diagnosis not present

## 2023-11-09 DIAGNOSIS — R400 Somnolence: Secondary | ICD-10-CM | POA: Diagnosis not present

## 2023-11-09 DIAGNOSIS — E6609 Other obesity due to excess calories: Secondary | ICD-10-CM | POA: Diagnosis not present

## 2023-11-09 DIAGNOSIS — D518 Other vitamin B12 deficiency anemias: Secondary | ICD-10-CM | POA: Diagnosis not present

## 2023-11-09 DIAGNOSIS — Z9229 Personal history of other drug therapy: Secondary | ICD-10-CM | POA: Diagnosis not present

## 2023-11-09 DIAGNOSIS — J0100 Acute maxillary sinusitis, unspecified: Secondary | ICD-10-CM | POA: Diagnosis not present

## 2023-11-09 DIAGNOSIS — M353 Polymyalgia rheumatica: Secondary | ICD-10-CM | POA: Diagnosis not present

## 2023-11-09 DIAGNOSIS — T50905A Adverse effect of unspecified drugs, medicaments and biological substances, initial encounter: Secondary | ICD-10-CM | POA: Diagnosis not present

## 2023-11-09 DIAGNOSIS — Z6830 Body mass index (BMI) 30.0-30.9, adult: Secondary | ICD-10-CM | POA: Diagnosis not present

## 2023-11-09 DIAGNOSIS — K219 Gastro-esophageal reflux disease without esophagitis: Secondary | ICD-10-CM | POA: Diagnosis not present

## 2023-11-09 DIAGNOSIS — G894 Chronic pain syndrome: Secondary | ICD-10-CM | POA: Diagnosis not present

## 2023-11-09 DIAGNOSIS — I739 Peripheral vascular disease, unspecified: Secondary | ICD-10-CM | POA: Diagnosis not present

## 2023-12-06 DIAGNOSIS — M5416 Radiculopathy, lumbar region: Secondary | ICD-10-CM | POA: Diagnosis not present

## 2023-12-06 DIAGNOSIS — J0100 Acute maxillary sinusitis, unspecified: Secondary | ICD-10-CM | POA: Diagnosis not present

## 2023-12-06 DIAGNOSIS — I700 Atherosclerosis of aorta: Secondary | ICD-10-CM | POA: Diagnosis not present

## 2023-12-06 DIAGNOSIS — E6609 Other obesity due to excess calories: Secondary | ICD-10-CM | POA: Diagnosis not present

## 2023-12-06 DIAGNOSIS — I10 Essential (primary) hypertension: Secondary | ICD-10-CM | POA: Diagnosis not present

## 2023-12-06 DIAGNOSIS — Z6830 Body mass index (BMI) 30.0-30.9, adult: Secondary | ICD-10-CM | POA: Diagnosis not present

## 2023-12-06 DIAGNOSIS — G894 Chronic pain syndrome: Secondary | ICD-10-CM | POA: Diagnosis not present

## 2024-03-01 ENCOUNTER — Other Ambulatory Visit: Payer: Self-pay | Admitting: *Deleted

## 2024-03-01 DIAGNOSIS — I70211 Atherosclerosis of native arteries of extremities with intermittent claudication, right leg: Secondary | ICD-10-CM

## 2024-03-04 ENCOUNTER — Ambulatory Visit (HOSPITAL_COMMUNITY)
Admission: RE | Admit: 2024-03-04 | Discharge: 2024-03-04 | Disposition: A | Discharge status: Home / Self Care | Source: Ambulatory Visit | Attending: Surgery | Admitting: Surgery

## 2024-03-04 ENCOUNTER — Ambulatory Visit (INDEPENDENT_AMBULATORY_CARE_PROVIDER_SITE_OTHER): Admitting: Physician Assistant

## 2024-03-04 VITALS — BP 113/80 | HR 78 | Temp 97.3°F | Ht 63.0 in | Wt 188.0 lb

## 2024-03-04 DIAGNOSIS — I70211 Atherosclerosis of native arteries of extremities with intermittent claudication, right leg: Secondary | ICD-10-CM | POA: Diagnosis not present

## 2024-03-04 LAB — VAS US ABI WITH/WO TBI
Left ABI: 0.97
Right ABI: 0.85

## 2024-03-04 NOTE — Progress Notes (Signed)
 [facility] [place] HISTORY AND PHYSICAL   History of Present Illness:  Patient is a 43 y.o. year old female who presents for evaluation of claudication.  She was initially treated medically with a walking program and Pletal.  She developed tachycardia that was treated with metoprolol.  Her symptoms have improved.  She has switched jobs and sits more, so her legs don't seem to hurt as much any more.   Unfortunately she continues to smoke daily.    On her last visit Dr. Myra Gianotti reviewed a CTA abdomen pelvis with bilateral runoff to better define her occlusion to see what options we may have for endovascular repair.   This demonstrated Right iliac artery occlusion.  The plan is for her to continue with medical management due to her young age with options  that her iliac stenosis can be treated with stenting if she does not tolerate medical management alone.         Past Medical History:  Diagnosis Date   Acid reflux    Hypertension     Past Surgical History:  Procedure Laterality Date   cardiac catherization     CHOLECYSTECTOMY     HAND SURGERY      ROS:   General:  No weight loss, Fever, chills  HEENT: No recent headaches, no nasal bleeding, no visual changes, no sore throat  Neurologic: No dizziness, blackouts, seizures. No recent symptoms of stroke or mini- stroke. No recent episodes of slurred speech, or temporary blindness.  Cardiac: No recent episodes of chest pain/pressure, no shortness of breath at rest.  No shortness of breath with exertion.  Denies history of atrial fibrillation or irregular heartbeat  Vascular: No history of rest pain in feet.  No history of claudication.  No history of non-healing ulcer, No history of DVT   Pulmonary: No home oxygen, no productive cough, no hemoptysis,  No asthma or wheezing  Musculoskeletal:  [ ]  Arthritis, [ ]  Low back pain,  [ ]  Joint pain  Hematologic:No history of hypercoagulable state.  No history of easy  bleeding.  No history of anemia  Gastrointestinal: No hematochezia or melena,  No gastroesophageal reflux, no trouble swallowing  Urinary: [ ]  chronic Kidney disease, [ ]  on HD - [ ]  MWF or [ ]  TTHS, [ ]  Burning with urination, [ ]  Frequent urination, [ ]  Difficulty urinating;   Skin: No rashes  Psychological: No history of anxiety,  No history of depression  Social History Social History   Tobacco Use   Smoking status: Every Day    Current packs/day: 1.00    Types: Cigarettes  Substance Use Topics   Alcohol use: No   Drug use: No    Family History History reviewed. No pertinent family history.  Allergies  Allergies  Allergen Reactions   Amoxicillin Hives     Current Outpatient Medications  Medication Sig Dispense Refill   ALPRAZolam (XANAX) 0.5 MG tablet Take 1 tablet by mouth 4 (four) times daily. Using prn     cilostazol (PLETAL) 100 MG tablet Take 1 tablet (100 mg total) by mouth 2 (two) times daily before a meal. 60 tablet 11   metoprolol tartrate (LOPRESSOR) 25 MG tablet Take 1 tablet (25 mg total) by mouth daily. 30 tablet 6   omeprazole (PRILOSEC) 20 MG capsule Take 20 mg by mouth 2 (two) times daily before a meal.     rosuvastatin (CRESTOR) 10 MG tablet Take 1 tablet (10 mg total) by mouth daily.  30 tablet 12   HYDROcodone-acetaminophen (NORCO) 10-325 MG tablet Take 1 tablet by mouth every 4 (four) hours as needed. (Patient not taking: Reported on 03/04/2024)     medroxyPROGESTERone (DEPO-PROVERA) 150 MG/ML injection Inject 150 mg into the muscle every 3 (three) months. (Patient not taking: Reported on 03/04/2024)     No current facility-administered medications for this visit.    Physical Examination  Vitals:   03/04/24 1325  BP: 113/80  Pulse: 78  Temp: (!) 97.3 F (36.3 C)  TempSrc: Temporal  SpO2: 96%  Weight: 188 lb (85.3 kg)  Height: 5\' 3"  (1.6 m)    Body mass index is 33.3 kg/m.  General:  Alert and oriented, no acute distress HEENT:  Normal Neck: No bruit or JVD Pulmonary: Clear to auscultation bilaterally Cardiac: Regular Rate and Rhythm without murmur Abdomen: Soft, non-tender, non-distended, no mass, no scars Skin: No rash Extremity Pulses:  dorsalis pedis pulses bilaterally Musculoskeletal: No deformity or edema  Neurologic: Upper and lower extremity motor 5/5 and symmetric  DATA:  ABI Findings:  +---------+------------------+-----+-----------+--------+  Right   Rt Pressure (mmHg)IndexWaveform   Comment   +---------+------------------+-----+-----------+--------+  Brachial 156                                         +---------+------------------+-----+-----------+--------+  PTA     132               0.85 multiphasic          +---------+------------------+-----+-----------+--------+  DP      131               0.84 multiphasic          +---------+------------------+-----+-----------+--------+  Great Toe94                0.60                      +---------+------------------+-----+-----------+--------+   +---------+------------------+-----+-----------+-------+  Left    Lt Pressure (mmHg)IndexWaveform   Comment  +---------+------------------+-----+-----------+-------+  Brachial 148                                        +---------+------------------+-----+-----------+-------+  PTA     151               0.97 multiphasic         +---------+------------------+-----+-----------+-------+  DP      149               0.96 multiphasic         +---------+------------------+-----+-----------+-------+  Great Toe145               0.93                     +---------+------------------+-----+-----------+-------+   +-------+-----------+-----------+------------+------------+  ABI/TBIToday's ABIToday's TBIPrevious ABIPrevious TBI  +-------+-----------+-----------+------------+------------+  Right 0.85       0.60       0.85        0.50           +-------+-----------+-----------+------------+------------+  Left  0.97       0.93       1.29        0.97          +-------+-----------+-----------+------------+------------+    Bilateral ABIs appear  essentially unchanged compared to prior study on  10/10/22.    Summary:  Right: Resting right ankle-brachial index indicates mild right lower  extremity arterial disease. The right toe-brachial index is abnormal.   Left: Resting left ankle-brachial index is within normal range. The left  toe-brachial index is normal.    ASSESSMENT/PLAN: PAD with claudication symptoms improving on medical management with Stain and Pletal.   She dose not have claudication symptoms as often and denies rest pain or non healing wounds.   She has palpable pedal pulses.    The ABI have multiphasic wave forms and unchanged from a year ago.   We talked in detail about a walking program, smoking cessation, and ever incorporating light weights.  I think if she starts exercising she will stop smoking.  Is she develops symptoms of ischemia she will call our office other wise she will f/u in 1 year for repeat ABI's.   Mosetta Pigeon PA-C [facility] [place] Office: [phone]  MD in clinic [place]

## 2024-08-27 ENCOUNTER — Ambulatory Visit
Admission: EM | Admit: 2024-08-27 | Discharge: 2024-08-27 | Disposition: A | Discharge status: Home / Self Care | Attending: Family Medicine | Admitting: Family Medicine

## 2024-08-27 DIAGNOSIS — I739 Peripheral vascular disease, unspecified: Secondary | ICD-10-CM

## 2024-08-27 DIAGNOSIS — N926 Irregular menstruation, unspecified: Secondary | ICD-10-CM

## 2024-08-27 DIAGNOSIS — E785 Hyperlipidemia, unspecified: Secondary | ICD-10-CM | POA: Diagnosis not present

## 2024-08-27 DIAGNOSIS — R030 Elevated blood-pressure reading, without diagnosis of hypertension: Secondary | ICD-10-CM | POA: Diagnosis not present

## 2024-08-27 LAB — POCT URINE PREGNANCY: Preg Test, Ur: NEGATIVE

## 2024-08-27 MED ORDER — METOPROLOL TARTRATE 25 MG PO TABS: 25.0000 mg | ORAL_TABLET | Freq: Every day | ORAL | 2 refills | Status: AC

## 2024-08-27 MED ORDER — ROSUVASTATIN CALCIUM 10 MG PO TABS: 10.0000 mg | ORAL_TABLET | Freq: Every day | ORAL | 2 refills | Status: AC

## 2024-08-27 MED ORDER — CILOSTAZOL 100 MG PO TABS: 100.0000 mg | ORAL_TABLET | Freq: Two times a day (BID) | ORAL | 2 refills | Status: AC

## 2024-08-27 NOTE — Discharge Instructions (Signed)
 You may go to the [facility] health website and click on the find a provider tab to find a provider taking new patients and schedule your new patient appointment.  I have refilled your medications for a 21-month supply and we have obtained some labs for safety today.  We will let you know if anything comes back abnormal on these.

## 2024-08-27 NOTE — ED Triage Notes (Signed)
 Pt is requesting a script for cilostazol  be refilled. States doesn't currently have PCP.

## 2024-08-27 NOTE — ED Provider Notes (Signed)
 [facility]    CSN: [id number] Arrival date & time: 08/27/24  1036      History   Chief Complaint No chief complaint on file.   HPI [name] is a 43 y.o. female.   Patient presenting today requesting medication refills, particularly for her Pletal  as she just ran out yesterday.  She states she takes this for peripheral artery disease.  She is also on Crestor  for hyperlipidemia and metoprolol  for hypertension.  Her primary care provider recently retired with no notice and she is left without a prior care provider.  Denies any side effects to the medications, chest pain, shortness of breath, palpitations, lower extremity edema.    Past Medical History:  Diagnosis Date   Acid reflux    Hypertension     There are no active problems to display for this patient.   Past Surgical History:  Procedure Laterality Date   cardiac catherization     CHOLECYSTECTOMY     HAND SURGERY      OB History   No obstetric history on file.      Home Medications    Prior to Admission medications   Medication Sig Start Date End Date Taking? Authorizing Provider  ALPRAZolam (XANAX) 0.5 MG tablet Take 1 tablet by mouth 4 (four) times daily. Using prn 01/12/21   [provider]  cilostazol  (PLETAL ) 100 MG tablet Take 1 tablet (100 mg total) by mouth 2 (two) times daily before a meal. 08/27/24   Stuart Vernell Norris, PA-C  HYDROcodone -acetaminophen  (NORCO) 10-325 MG tablet Take 1 tablet by mouth every 4 (four) hours as needed. Patient not taking: Reported on 03/04/2024    [provider]  medroxyPROGESTERone  (DEPO-PROVERA ) 150 MG/ML injection Inject 150 mg into the muscle every 3 (three) months. Patient not taking: Reported on 03/04/2024 10/05/22   [provider]  metoprolol  tartrate (LOPRESSOR ) 25 MG tablet Take 1 tablet (25 mg total) by mouth daily. 08/27/24   Stuart Vernell Norris, PA-C  omeprazole (PRILOSEC) 20 MG capsule Take 20 mg by mouth 2 (two)  times daily before a meal.    [provider]  rosuvastatin  (CRESTOR ) 10 MG tablet Take 1 tablet (10 mg total) by mouth daily. 08/27/24   Stuart Vernell Norris, PA-C    Family History No family history on file.  Social History Social History   Tobacco Use   Smoking status: Every Day    Current packs/day: 1.00    Types: Cigarettes  Substance Use Topics   Alcohol use: No   Drug use: No     Allergies   Amoxicillin   Review of Systems Review of Systems Per HPI  Physical Exam Triage Vital Signs ED Triage Vitals  Encounter Vitals Group     BP 08/27/24 1046 (!) 144/95     Girls Systolic BP Percentile --      Girls Diastolic BP Percentile --      Boys Systolic BP Percentile --      Boys Diastolic BP Percentile --      Pulse Rate 08/27/24 1046 98     Resp 08/27/24 1046 18     Temp 08/27/24 1046 98.5 F (36.9 C)     Temp Source 08/27/24 1046 Oral     SpO2 08/27/24 1046 95 %     Weight --      Height --      Head Circumference --      Peak Flow --      Pain  Score 08/27/24 1050 0     Pain Loc --      Pain Education --      Exclude from Growth Chart --    No data found.  Updated Vital Signs BP (!) 144/95 (BP Location: Right Arm)   Pulse 98   Temp 98.5 F (36.9 C) (Oral)   Resp 18   SpO2 95%   Visual Acuity Right Eye Distance:   Left Eye Distance:   Bilateral Distance:    Right Eye Near:   Left Eye Near:    Bilateral Near:     Physical Exam Vitals and nursing note reviewed.  Constitutional:      Appearance: Normal appearance. She is not ill-appearing.  HENT:     Head: Atraumatic.  Eyes:     Extraocular Movements: Extraocular movements intact.     Conjunctiva/sclera: Conjunctivae normal.  Cardiovascular:     Rate and Rhythm: Normal rate and regular rhythm.     Heart sounds: Normal heart sounds.  Pulmonary:     Effort: Pulmonary effort is normal.     Breath sounds: Normal breath sounds.  Musculoskeletal:        General: Normal range of  motion.     Cervical back: Normal range of motion and neck supple.  Skin:    General: Skin is warm and dry.  Neurological:     Mental Status: She is alert and oriented to person, place, and time.  Psychiatric:        Mood and Affect: Mood normal.        Thought Content: Thought content normal.        Judgment: Judgment normal.      UC Treatments / Results  Labs (all labs ordered are listed, but only abnormal results are displayed) Labs Reviewed  COMPREHENSIVE METABOLIC PANEL WITH GFR  CBC WITH DIFFERENTIAL/PLATELET  POCT URINE PREGNANCY    EKG   Radiology No results found.  Procedures Procedures (including critical care time)  Medications Ordered in UC Medications - No data to display  Initial Impression / Assessment and Plan / UC Course  I have reviewed the triage vital signs and the nursing notes.  Pertinent labs & imaging results that were available during my care of the patient were reviewed by me and considered in my medical decision making (see chart for details).     No recent labs on file and unable to obtain records from her past primary care at this time so we will obtain CBC, CMP for safety check with refilling these medications.  Urine pregnancy obtained as she has irregular menstrual cycles and has been off of her Depo injections since 2024 per recent OB/GYN visit.  This was negative.  Her blood pressure is very mildly elevated today, monitor home readings and bring this information to new primary care to see if any adjustments need to be made.  Follow-up for any worsening symptoms in the meantime.  Resources reviewed for finding a new primary care provider.  Final Clinical Impressions(s) / UC Diagnoses   Final diagnoses:  PAD (peripheral artery disease)  Hyperlipidemia, unspecified hyperlipidemia type  Elevated blood pressure reading  Irregular menstrual cycle     Discharge Instructions      You may go to the [facility] health website and click on the  find a provider tab to find a provider taking new patients and schedule your new patient appointment.  I have refilled your medications for a 55-month supply and we have obtained some  labs for safety today.  We will let you know if anything comes back abnormal on these.    ED Prescriptions     Medication Sig Dispense Auth. Provider   cilostazol  (PLETAL ) 100 MG tablet Take 1 tablet (100 mg total) by mouth 2 (two) times daily before a meal. 60 tablet Stuart Vernell Norris, PA-C   metoprolol  tartrate (LOPRESSOR ) 25 MG tablet Take 1 tablet (25 mg total) by mouth daily. 30 tablet Stuart Vernell Norris, PA-C   rosuvastatin  (CRESTOR ) 10 MG tablet Take 1 tablet (10 mg total) by mouth daily. 30 tablet Stuart Vernell Norris, [place]      PDMP not reviewed this encounter.   Stuart Vernell Norris, [place] 08/27/24 1204

## 2024-08-28 ENCOUNTER — Ambulatory Visit (HOSPITAL_COMMUNITY): Payer: Self-pay

## 2024-08-28 LAB — COMPREHENSIVE METABOLIC PANEL WITH GFR
ALT: 11 IU/L (ref 0–32)
AST: 16 IU/L (ref 0–40)
Albumin: 3.9 g/dL (ref 3.9–4.9)
Alkaline Phosphatase: 72 IU/L (ref 41–116)
BUN/Creatinine Ratio: 7 — ABNORMAL LOW (ref 9–23)
BUN: 5 mg/dL — ABNORMAL LOW (ref 6–24)
Bilirubin Total: 0.5 mg/dL (ref 0.0–1.2)
CO2: 23 mmol/L (ref 20–29)
Calcium: 8.9 mg/dL (ref 8.7–10.2)
Chloride: 104 mmol/L (ref 96–106)
Creatinine, Ser: 0.7 mg/dL (ref 0.57–1.00)
Globulin, Total: 2.4 g/dL (ref 1.5–4.5)
Glucose: 94 mg/dL (ref 70–99)
Potassium: 3.4 mmol/L — ABNORMAL LOW (ref 3.5–5.2)
Sodium: 143 mmol/L (ref 134–144)
Total Protein: 6.3 g/dL (ref 6.0–8.5)
eGFR: 110 mL/min/1.73 (ref >59)

## 2024-08-28 LAB — CBC WITH DIFFERENTIAL/PLATELET
Basophils Absolute: 0 x10E3/uL (ref 0.0–0.2)
Basos: 1 %
EOS (ABSOLUTE): 0.1 x10E3/uL (ref 0.0–0.4)
Eos: 1 %
Hematocrit: 38.9 % (ref 34.0–46.6)
Hemoglobin: 12.7 g/dL (ref 11.1–15.9)
Immature Grans (Abs): 0 x10E3/uL (ref 0.0–0.1)
Immature Granulocytes: 0 %
Lymphocytes Absolute: 2 x10E3/uL (ref 0.7–3.1)
Lymphs: 29 %
MCH: 32 pg (ref 26.6–33.0)
MCHC: 32.6 g/dL (ref 31.5–35.7)
MCV: 98 fL — ABNORMAL HIGH (ref 79–97)
Monocytes Absolute: 0.3 x10E3/uL (ref 0.1–0.9)
Monocytes: 4 %
Neutrophils Absolute: 4.4 x10E3/uL (ref 1.4–7.0)
Neutrophils: 65 %
Platelets: 285 x10E3/uL (ref 150–450)
RBC: 3.97 x10E6/uL (ref 3.77–5.28)
RDW: 14.5 % (ref 11.7–15.4)
WBC: 6.8 x10E3/uL (ref 3.4–10.8)
